# Patient Record
Sex: Female | Born: 1935
Health system: Southern US, Community
[De-identification: ages and names within clinical notes are randomized; demographics above are authoritative.]

## PROBLEM LIST (undated history)

## (undated) DIAGNOSIS — K219 Gastro-esophageal reflux disease without esophagitis: Secondary | ICD-10-CM

## (undated) DIAGNOSIS — I10 Essential (primary) hypertension: Secondary | ICD-10-CM

## (undated) DIAGNOSIS — K5792 Diverticulitis of intestine, part unspecified, without perforation or abscess without bleeding: Secondary | ICD-10-CM

## (undated) HISTORY — PX: ABDOMINAL HYSTERECTOMY: SHX81

## (undated) HISTORY — PX: ABDOMINAL SURGERY: SHX537

---

## 1998-05-28 ENCOUNTER — Encounter: Payer: Self-pay | Admitting: Orthopedic Surgery

## 1998-05-28 ENCOUNTER — Ambulatory Visit (HOSPITAL_COMMUNITY): Admission: RE | Admit: 1998-05-28 | Discharge: 1998-05-28 | Payer: Self-pay | Admitting: Orthopedic Surgery

## 1999-12-06 ENCOUNTER — Encounter: Admission: RE | Admit: 1999-12-06 | Discharge: 1999-12-06 | Payer: Self-pay | Admitting: Internal Medicine

## 1999-12-06 ENCOUNTER — Encounter: Payer: Self-pay | Admitting: Internal Medicine

## 2000-04-03 ENCOUNTER — Encounter: Payer: Self-pay | Admitting: Internal Medicine

## 2000-04-03 ENCOUNTER — Encounter: Admission: RE | Admit: 2000-04-03 | Discharge: 2000-04-03 | Payer: Self-pay | Admitting: Internal Medicine

## 2002-04-07 ENCOUNTER — Encounter: Payer: Self-pay | Admitting: Internal Medicine

## 2002-04-07 ENCOUNTER — Encounter: Admission: RE | Admit: 2002-04-07 | Discharge: 2002-04-07 | Payer: Self-pay | Admitting: Internal Medicine

## 2002-04-11 ENCOUNTER — Encounter: Payer: Self-pay | Admitting: Internal Medicine

## 2002-04-11 ENCOUNTER — Encounter: Admission: RE | Admit: 2002-04-11 | Discharge: 2002-04-11 | Payer: Self-pay | Admitting: Internal Medicine

## 2002-07-01 ENCOUNTER — Encounter: Payer: Self-pay | Admitting: Orthopedic Surgery

## 2002-07-07 ENCOUNTER — Observation Stay (HOSPITAL_COMMUNITY): Admission: RE | Admit: 2002-07-07 | Discharge: 2002-07-08 | Payer: Self-pay | Admitting: Orthopedic Surgery

## 2003-04-12 ENCOUNTER — Encounter: Admission: RE | Admit: 2003-04-12 | Discharge: 2003-04-12 | Payer: Self-pay | Admitting: Internal Medicine

## 2003-05-12 ENCOUNTER — Other Ambulatory Visit: Admission: RE | Admit: 2003-05-12 | Discharge: 2003-05-12 | Payer: Self-pay | Admitting: Internal Medicine

## 2004-07-04 ENCOUNTER — Emergency Department (HOSPITAL_COMMUNITY): Admission: EM | Admit: 2004-07-04 | Discharge: 2004-07-04 | Payer: Self-pay | Admitting: Emergency Medicine

## 2004-07-11 ENCOUNTER — Encounter: Admission: RE | Admit: 2004-07-11 | Discharge: 2004-07-11 | Payer: Self-pay | Admitting: Internal Medicine

## 2005-04-04 ENCOUNTER — Emergency Department (HOSPITAL_COMMUNITY): Admission: EM | Admit: 2005-04-04 | Discharge: 2005-04-04 | Payer: Self-pay | Admitting: Emergency Medicine

## 2005-08-21 ENCOUNTER — Ambulatory Visit (HOSPITAL_BASED_OUTPATIENT_CLINIC_OR_DEPARTMENT_OTHER): Admission: RE | Admit: 2005-08-21 | Discharge: 2005-08-21 | Payer: Self-pay | Admitting: Orthopedic Surgery

## 2005-08-21 ENCOUNTER — Encounter (INDEPENDENT_AMBULATORY_CARE_PROVIDER_SITE_OTHER): Payer: Self-pay | Admitting: Specialist

## 2005-09-10 ENCOUNTER — Encounter: Admission: RE | Admit: 2005-09-10 | Discharge: 2005-09-10 | Payer: Self-pay | Admitting: Internal Medicine

## 2006-10-21 ENCOUNTER — Encounter: Admission: RE | Admit: 2006-10-21 | Discharge: 2006-10-21 | Payer: Self-pay | Admitting: Family Medicine

## 2007-11-30 ENCOUNTER — Encounter: Admission: RE | Admit: 2007-11-30 | Discharge: 2007-11-30 | Payer: Self-pay | Admitting: Family Medicine

## 2008-02-03 ENCOUNTER — Emergency Department (HOSPITAL_BASED_OUTPATIENT_CLINIC_OR_DEPARTMENT_OTHER): Admission: EM | Admit: 2008-02-03 | Discharge: 2008-02-03 | Payer: Self-pay | Admitting: Emergency Medicine

## 2008-02-03 ENCOUNTER — Ambulatory Visit: Payer: Self-pay | Admitting: Diagnostic Radiology

## 2008-08-10 ENCOUNTER — Encounter: Admission: RE | Admit: 2008-08-10 | Discharge: 2008-08-10 | Payer: Self-pay | Admitting: Family Medicine

## 2008-08-26 ENCOUNTER — Encounter: Admission: RE | Admit: 2008-08-26 | Discharge: 2008-08-26 | Payer: Self-pay | Admitting: Family Medicine

## 2008-10-05 ENCOUNTER — Encounter: Admission: RE | Admit: 2008-10-05 | Discharge: 2008-11-09 | Payer: Self-pay | Admitting: Anesthesiology

## 2008-10-05 ENCOUNTER — Encounter: Admission: RE | Admit: 2008-10-05 | Discharge: 2008-11-09 | Payer: Self-pay | Admitting: Neurological Surgery

## 2008-11-04 ENCOUNTER — Ambulatory Visit: Payer: Self-pay | Admitting: Internal Medicine

## 2008-11-04 ENCOUNTER — Inpatient Hospital Stay (HOSPITAL_COMMUNITY): Admission: EM | Admit: 2008-11-04 | Discharge: 2008-11-09 | Payer: Self-pay | Admitting: Emergency Medicine

## 2008-11-05 ENCOUNTER — Encounter: Payer: Self-pay | Admitting: Internal Medicine

## 2008-11-06 ENCOUNTER — Encounter: Payer: Self-pay | Admitting: Internal Medicine

## 2008-11-30 ENCOUNTER — Encounter: Admission: RE | Admit: 2008-11-30 | Discharge: 2008-11-30 | Payer: Self-pay | Admitting: Family Medicine

## 2008-12-26 IMAGING — CR DG HIP COMPLETE 2+V*R*
3 series · 7 of 7 positions shown · non-contrast
Comparison: NONE

CLINICAL DATA: Pain. 

RIGHT HIP

[Series 1: view not recorded · 0.17mm/px · 2 of 2 slices shown (1 of 3)]
[im 1/2]
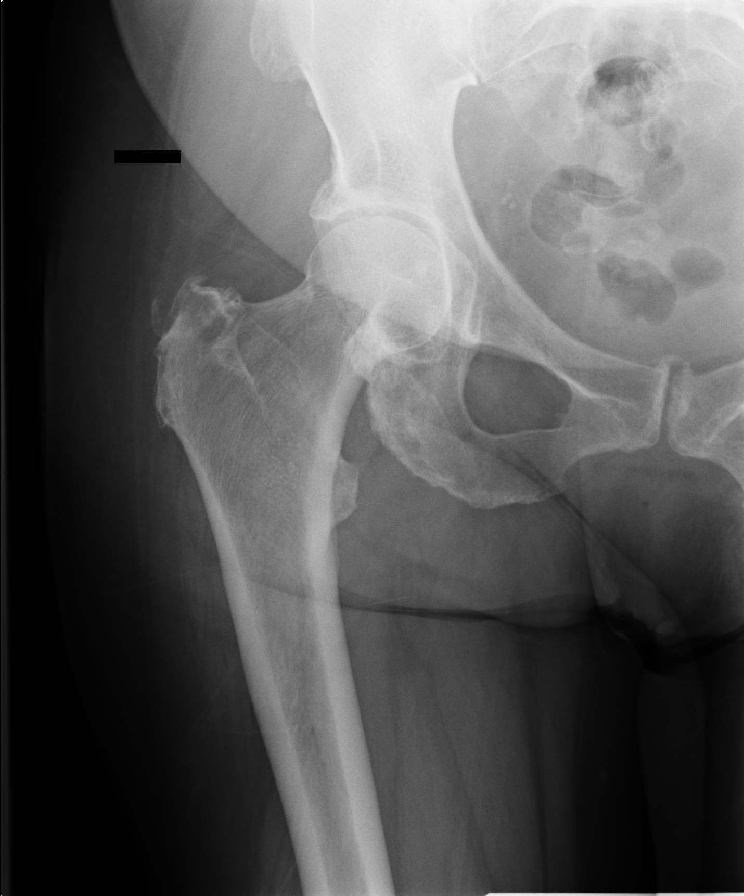
[im 2/2]
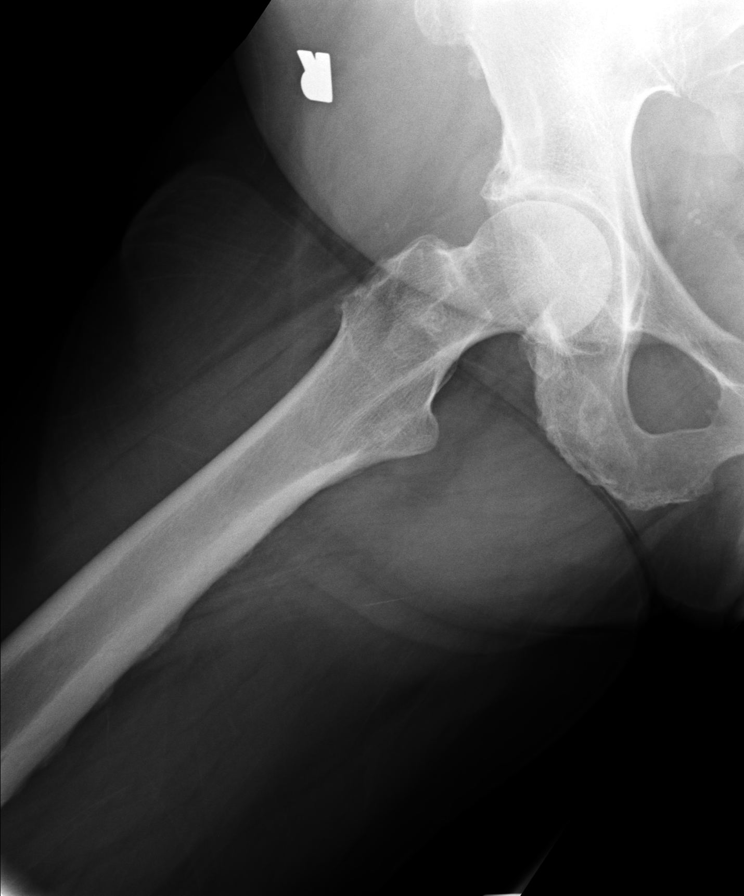

[Series 2: view not recorded · 0.17mm/px · 4 of 4 slices shown (2 of 3)]
[im 1/4]
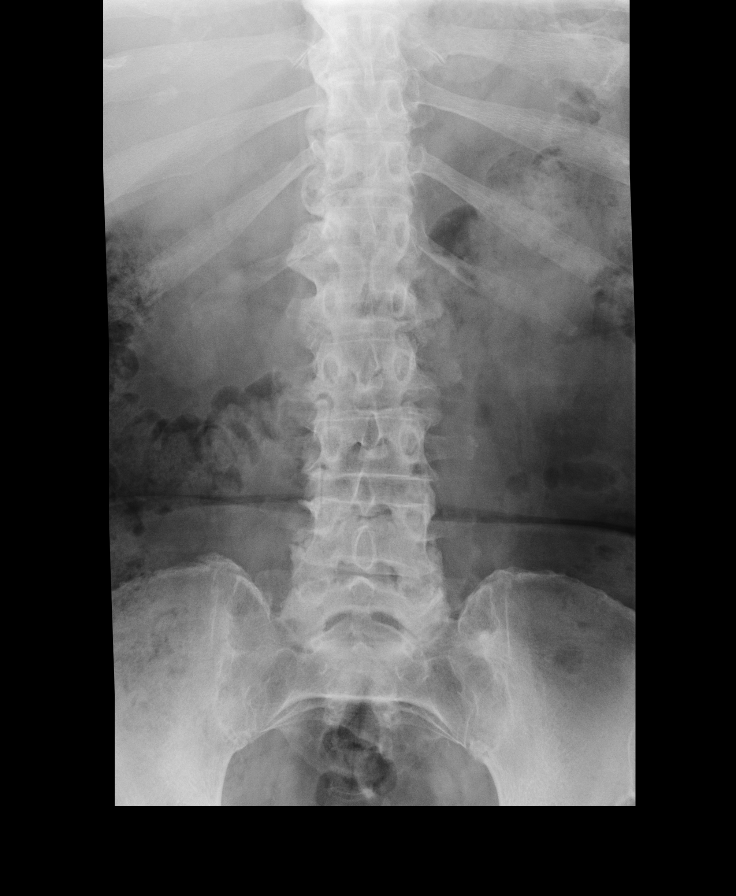
[im 2/4]
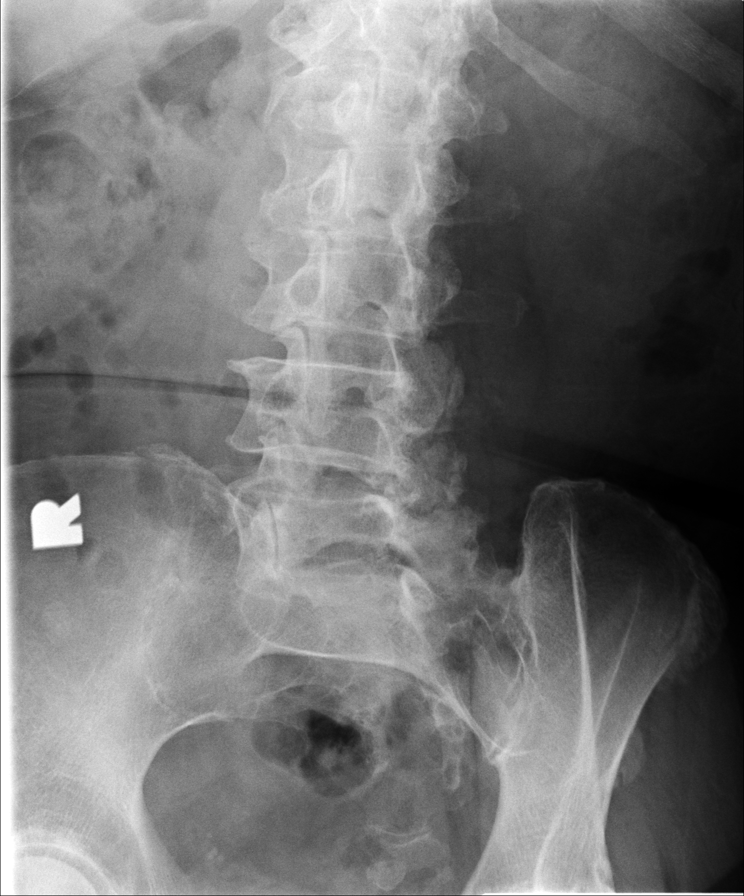
[im 3/4]
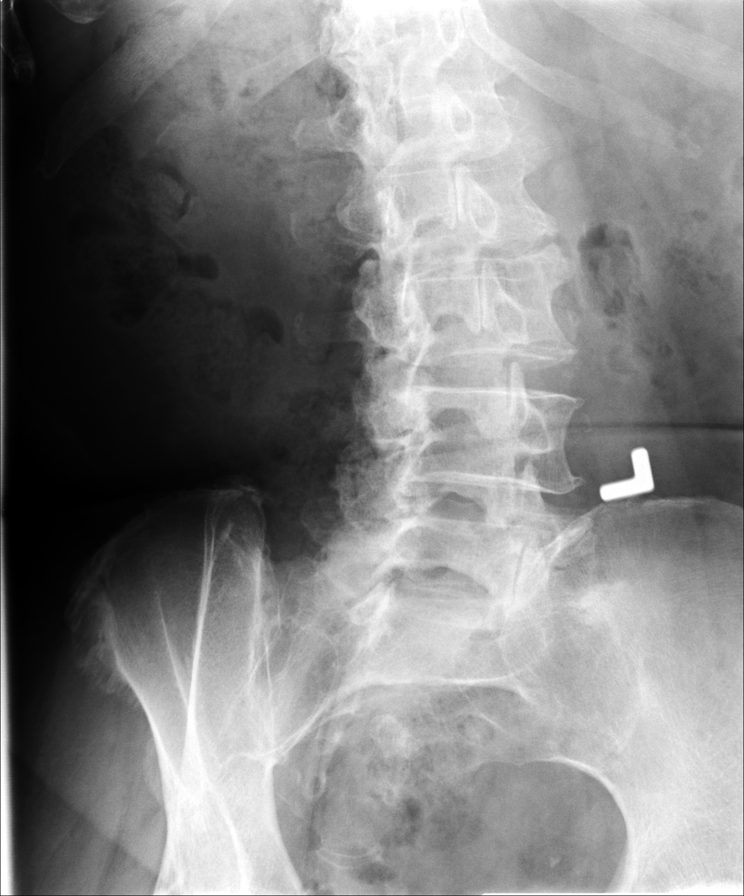
[im 4/4]
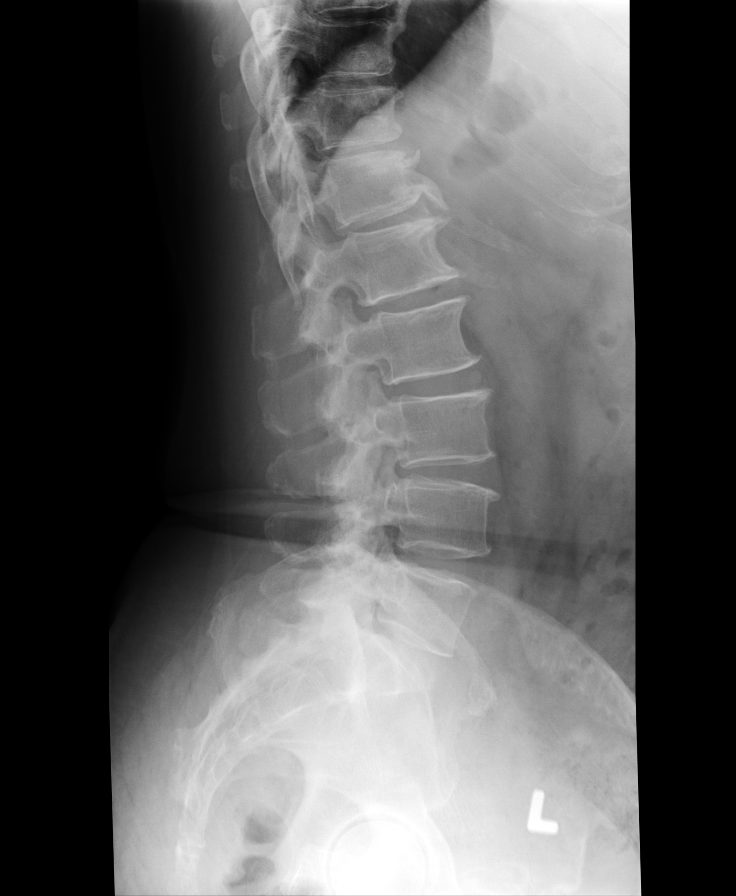

[view not recorded (3 of 3)]
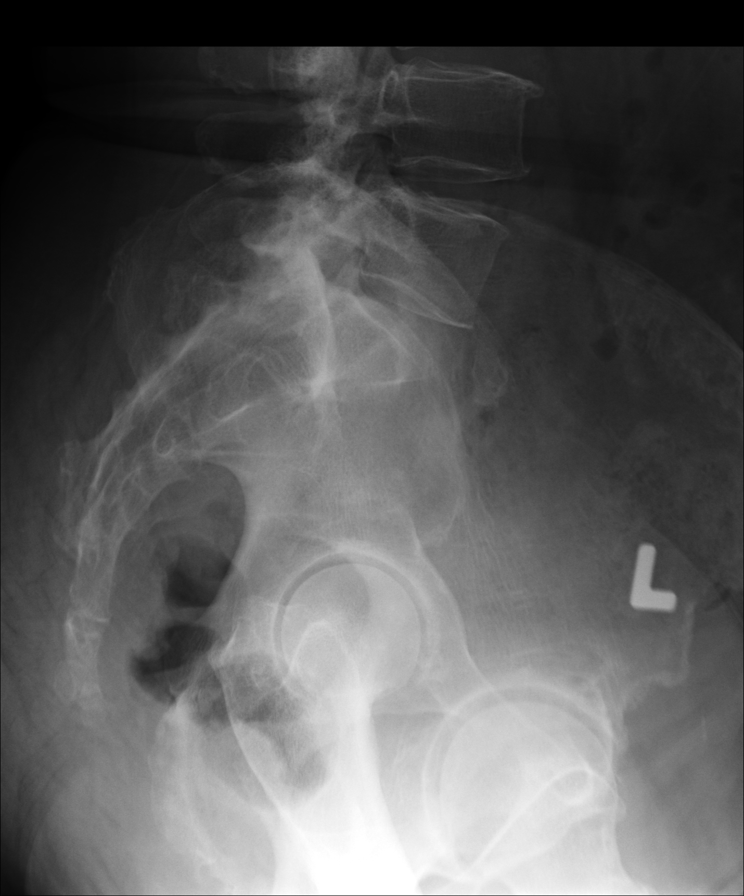

[7 of 7 positions shown; findings below may reference images not displayed]

FINDINGS: Minimal acetabular spurring is noted. No fracture, 
dislocation, or loose bodies. Spurring is noted at the level of 
the greater trochanter. There is a tiny calcification adjacent to 
the greater trochanter suggesting an area of minimal calcific 
bursitis.
IMPRESSION: Degenerative changes of the right hip. Probable 
calcific bursitis. Blomberg, Flore electronically reviewed 
on 10/05/2006 Dict Date: 10/05/2006  Tran Date: 10/05/2006 DAS  [REDACTED]

## 2009-12-19 ENCOUNTER — Encounter: Admission: RE | Admit: 2009-12-19 | Discharge: 2009-12-19 | Payer: Self-pay | Admitting: Family Medicine

## 2010-03-02 ENCOUNTER — Encounter: Payer: Self-pay | Admitting: Family Medicine

## 2010-05-04 ENCOUNTER — Emergency Department (HOSPITAL_BASED_OUTPATIENT_CLINIC_OR_DEPARTMENT_OTHER)
Admission: EM | Admit: 2010-05-04 | Discharge: 2010-05-05 | Disposition: A | Discharge status: Nursing Home (Includes ICF) | Payer: Medicare Other | Source: Home / Self Care | Attending: Emergency Medicine | Admitting: Emergency Medicine

## 2010-05-04 ENCOUNTER — Emergency Department (INDEPENDENT_AMBULATORY_CARE_PROVIDER_SITE_OTHER): Payer: Medicare Other

## 2010-05-04 DIAGNOSIS — K625 Hemorrhage of anus and rectum: Secondary | ICD-10-CM

## 2010-05-04 DIAGNOSIS — I10 Essential (primary) hypertension: Secondary | ICD-10-CM | POA: Insufficient documentation

## 2010-05-04 DIAGNOSIS — K922 Gastrointestinal hemorrhage, unspecified: Secondary | ICD-10-CM | POA: Insufficient documentation

## 2010-05-04 DIAGNOSIS — K219 Gastro-esophageal reflux disease without esophagitis: Secondary | ICD-10-CM | POA: Insufficient documentation

## 2010-05-04 DIAGNOSIS — M810 Age-related osteoporosis without current pathological fracture: Secondary | ICD-10-CM | POA: Insufficient documentation

## 2010-05-04 DIAGNOSIS — E7800 Pure hypercholesterolemia, unspecified: Secondary | ICD-10-CM | POA: Insufficient documentation

## 2010-05-04 DIAGNOSIS — Z79899 Other long term (current) drug therapy: Secondary | ICD-10-CM | POA: Insufficient documentation

## 2010-05-04 LAB — COMPREHENSIVE METABOLIC PANEL
Albumin: 4 g/dL (ref 3.5–5.2)
BUN: 27 mg/dL — ABNORMAL HIGH (ref 6–23)
CO2: 28 mEq/L (ref 19–32)
Calcium: 9.1 mg/dL (ref 8.4–10.5)
Chloride: 104 mEq/L (ref 96–112)
Creatinine, Ser: 1 mg/dL (ref 0.4–1.2)
GFR calc Af Amer: >60 mL/min (ref >60)
Glucose, Bld: 121 mg/dL — ABNORMAL HIGH (ref 70–99)
Potassium: 4 mEq/L (ref 3.5–5.1)
Total Protein: 7.7 g/dL (ref 6.0–8.3)

## 2010-05-04 LAB — DIFFERENTIAL: Neutro Abs: 4.3 10*3/uL (ref 1.7–7.7)

## 2010-05-04 LAB — URINALYSIS, ROUTINE W REFLEX MICROSCOPIC
Leukocytes, UA: NEGATIVE
Protein, ur: NEGATIVE mg/dL
Specific Gravity, Urine: 1.021 (ref 1.005–1.030)
Urobilinogen, UA: 1 mg/dL (ref 0.0–1.0)

## 2010-05-04 LAB — CBC
MCH: 29.5 pg (ref 26.0–34.0)
MCHC: 34.1 g/dL (ref 30.0–36.0)
Platelets: 275 10*3/uL (ref 150–400)
RBC: 3.8 MIL/uL — ABNORMAL LOW (ref 3.87–5.11)
RDW: 12.9 % (ref 11.5–15.5)
WBC: 6.6 10*3/uL (ref 4.0–10.5)

## 2010-05-04 LAB — URINE MICROSCOPIC-ADD ON

## 2010-05-04 LAB — HEMOCCULT GUIAC POC 1CARD (OFFICE): Fecal Occult Bld: POSITIVE

## 2010-05-05 ENCOUNTER — Inpatient Hospital Stay (HOSPITAL_COMMUNITY)
Admission: EM | Admit: 2010-05-05 | Discharge: 2010-05-06 | DRG: 378 | Disposition: A | Discharge status: Home / Self Care | Payer: Medicare Other | Source: Other Acute Inpatient Hospital | Attending: Internal Medicine | Admitting: Internal Medicine

## 2010-05-05 DIAGNOSIS — K5731 Diverticulosis of large intestine without perforation or abscess with bleeding: Principal | ICD-10-CM | POA: Diagnosis present

## 2010-05-05 DIAGNOSIS — K219 Gastro-esophageal reflux disease without esophagitis: Secondary | ICD-10-CM | POA: Diagnosis present

## 2010-05-05 DIAGNOSIS — Z79899 Other long term (current) drug therapy: Secondary | ICD-10-CM

## 2010-05-05 DIAGNOSIS — M810 Age-related osteoporosis without current pathological fracture: Secondary | ICD-10-CM | POA: Diagnosis present

## 2010-05-05 DIAGNOSIS — D62 Acute posthemorrhagic anemia: Secondary | ICD-10-CM | POA: Diagnosis present

## 2010-05-05 DIAGNOSIS — I10 Essential (primary) hypertension: Secondary | ICD-10-CM | POA: Diagnosis present

## 2010-05-05 DIAGNOSIS — E785 Hyperlipidemia, unspecified: Secondary | ICD-10-CM | POA: Diagnosis present

## 2010-05-05 DIAGNOSIS — M1990 Unspecified osteoarthritis, unspecified site: Secondary | ICD-10-CM | POA: Diagnosis present

## 2010-05-05 LAB — BASIC METABOLIC PANEL
Calcium: 8.2 mg/dL — ABNORMAL LOW (ref 8.4–10.5)
Creatinine, Ser: 0.81 mg/dL (ref 0.4–1.2)
GFR calc Af Amer: >60 mL/min (ref >60)

## 2010-05-05 LAB — DIFFERENTIAL
Basophils Absolute: 0 10*3/uL (ref 0.0–0.1)
Basophils Relative: 1 % (ref 0–1)
Eosinophils Absolute: 0.2 10*3/uL (ref 0.0–0.7)
Eosinophils Relative: 4 % (ref 0–5)
Lymphs Abs: 1.7 10*3/uL (ref 0.7–4.0)

## 2010-05-05 LAB — CBC
HCT: 27.2 % — ABNORMAL LOW (ref 36.0–46.0)
Hemoglobin: 9.3 g/dL — ABNORMAL LOW (ref 12.0–15.0)
MCH: 29.5 pg (ref 26.0–34.0)
MCV: 86.3 fL (ref 78.0–100.0)
RDW: 13.4 % (ref 11.5–15.5)
WBC: 4.7 10*3/uL (ref 4.0–10.5)

## 2010-05-05 LAB — HEMOGLOBIN AND HEMATOCRIT, BLOOD: HCT: 28.8 % — ABNORMAL LOW (ref 36.0–46.0)

## 2010-05-05 LAB — TYPE AND SCREEN: ABO/RH(D): AB POS

## 2010-05-06 LAB — URINE CULTURE

## 2010-05-06 LAB — CBC
MCV: 86.9 fL (ref 78.0–100.0)
Platelets: 261 10*3/uL (ref 150–400)
RBC: 3.35 MIL/uL — ABNORMAL LOW (ref 3.87–5.11)
WBC: 4.5 10*3/uL (ref 4.0–10.5)

## 2010-05-06 LAB — BASIC METABOLIC PANEL
BUN: 13 mg/dL (ref 6–23)
CO2: 29 mEq/L (ref 19–32)
Calcium: 8.5 mg/dL (ref 8.4–10.5)
Creatinine, Ser: 0.9 mg/dL (ref 0.4–1.2)
Potassium: 3.4 mEq/L — ABNORMAL LOW (ref 3.5–5.1)

## 2010-05-08 NOTE — H&P (Signed)
NAMEANTHONIA, [name] NO.:  [id number]  MEDICAL RECORD NO.:  [id number]           PATIENT TYPE:  I  LOCATION:  3713                         FACILITY:  [facility]  PHYSICIAN:  [name], M.D. DATE OF BIRTH:  1935-01-17  DATE OF ADMISSION:  05/05/2010 DATE OF DISCHARGE:                             HISTORY & PHYSICAL   PRIMARY CARE PHYSICIAN:  Veronica Maduro A. Nicholos Johns, MD  CHIEF COMPLAINT:  Rectal bleeding.  HISTORY OF PRESENT ILLNESS:  This is a 76 year old female who was seen at the [facility] emergency department secondary to three episodes of rectal bleeding which she describes as being dark maroon color blood.  She states symptoms started since the a.m.  She denies having any abdominal pain, nausea, vomiting, diarrhea.  The patient reports recently having a pulled muscle in her right thigh and taking Naprosyn daily for her pain.  She also states she has been taking a muscle relaxer.  The patient had a similar hospitalization in September 2010.  She was evaluated by GI at that time by Dr. Stan Head and was found to have diverticulosis and a diverticular bleed.  The patient had severe anemia at that time and was transfused 3 units of blood.  Her admission hemoglobin level today was found to be 11.2.  The patient denies having any chest pain, shortness of breath, fevers, chills, nausea, vomiting, diarrhea, syncope, fatigue or weakness.  PAST MEDICAL HISTORY:  Significant for diverticulosis/diverticulitis, hypertension, dyslipidemia, osteoporosis, osteoarthritis, gastroesophageal reflux disease, status post left rotator cuff repair, status post hysterectomy, status post right carpal tunnel release, and right thumb release, status post exploratory laparotomy secondary to what sounded like a volvulus with bowel resection.  MEDICATIONS:  At this time include alendronate, cyclobenzaprine, hydrochlorothiazide, iron, Naprosyn and pravastatin.  ALLERGIES:   PENICILLIN causing hives.  SOCIAL HISTORY:  The patient is a nonsmoker, nondrinker.  No history of illicit drug usage.  FAMILY HISTORY:  Positive for hypertension in her father, positive for cancer in one brother and one sister.  Sister unknown type was thought to be a GYN cancer and her brother had oropharyngeal cancer.  REVIEW OF SYSTEMS:  Pertinent as mentioned above.  PHYSICAL EXAMINATION FINDINGS:  GENERAL:  This is a pleasant 76 year old elderly African American female who is well-nourished and well-developed and currently in no acute distress. VITAL SIGNS:  Temperature 98.5, blood pressure 120/68, heart rate 104 initially now 83, respirations 20, O2 sats 99-100%. HEENT:  Normocephalic, atraumatic.  Pupils equally round and reactive to light.  Extraocular movements are intact.  Funduscopic benign.  There is no scleral icterus.  Nares are patent bilaterally.  Oropharynx is clear. NECK:  Supple, full range of motion.  No thyromegaly, adenopathy, jugular venous distention. CARDIOVASCULAR:  Regular rate and rhythm.  No murmurs, gallops or rubs appreciated. LUNGS:  Clear to auscultation bilaterally.  No rales, rhonchi or wheezes. ABDOMEN:  Positive bowel sounds, soft, nontender, nondistended.  No hepatosplenomegaly. EXTREMITIES:  Without cyanosis, clubbing or edema. NEUROLOGIC:  Nonfocal.  LABORATORY STUDIES:  White blood cell count 6.6, hemoglobin 11.2, hematocrit 32.8, MCV 86.3, platelets 175, neutrophils 65%, lymphocytes  23%.  Rectal examination performed by EDP found to be Hemoccult positive, dark stool.  Sodium 142, potassium 4.0, chloride 104, CO2 28, BUN 27, creatinine 1.0 and glucose 121.  Albumin 4.0, AST 29, ALT 22. Urinalysis with trace hemoglobin but otherwise negative.  Acute abdominal series performed revealing no signs of obstruction.  No free air and no acute cardiopulmonary disease findings.  ASSESSMENT:  A 76 year old female being admitted with: 1. Rectal  bleeding. 2. Mild microcytic anemia secondary to rectal bleeding. 3. Gastroesophageal reflux disease. 4. Muscle strain. 5. Osteoporosis. 6. Osteoarthritis.  PLAN:  The patient will be admitted and serial H and Hs will be performed q.8 hours x48 hours.  IV fluids have been ordered for fluid resuscitation and maintenance therapy and the patient will be placed on clear liquids at this time.  IV Protonix therapy has been ordered and a GI consultation will be placed in the a.m. sooner if needed.  The patient's regular medications will be reviewed and reconciled.  Of note, the patient had been on Naprosyn therapy daily.  This may also be contributing to her episodes of GI bleeding secondary to nonsteroidal anti-inflammatory usage.  The Naprosyn therapy will be discontinued at this time.  A type and screen will also be sent and the patient will be transfused if needed.  SCDs have been ordered for DVT prophylaxis. Further workup will ensue pending results of the patient's clinical course.     [name], M.D.     HJ/MEDQ  D:  05/05/2010  T:  05/05/2010  Job:  [id number]  cc:   [name], M.D.  Electronically Signed by [name] M.D. on 05/08/2010 03:24:11 AM

## 2010-05-13 NOTE — Discharge Summary (Signed)
[name], [name] NO.:  [id number]  MEDICAL RECORD NO.:  [id number]           PATIENT TYPE:  I  LOCATION:  3713                         FACILITY:  [facility]  PHYSICIAN:  Veronica Nephew, MD       DATE OF BIRTH:  [birth year]  DATE OF ADMISSION:  05/05/2010 DATE OF DISCHARGE:  05/06/2010                        DISCHARGE SUMMARY - REFERRING   PRIMARY CARE PHYSICIAN:  Veronica Maduro A. Nicholos Johns, MD  DISCHARGE DIAGNOSES: 1. Hematochezia secondary to probable diverticular bleed, resolved. 2. Acute blood loss anemia, stable. 3. Hypertension. 4. Hyperlipidemia. 5. Gastroesophageal reflux disease.  DISCHARGE MEDICATIONS: 1. Acetaminophen 650 mg by mouth every 4 hours as needed. 2. Oxycodone 5 mg by mouth every 5 hours as needed. 3. Flexeril 5 mg 1 tablet by mouth daily at bedtime as needed. 4. Fosamax 70 mg 1 tablet by mouth every Tuesday as needed. 5. Hydrochlorothiazide 25 mg p.o. daily start 2 days after discharge. 6. Iron 325 mg p.o. daily. 7. Pravastatin 40 mg p.o. daily. 8. Senna docusate 1 tablet by mouth daily at bedtime as needed.  DISPOSITION:  Home.  DIET:  Heart-healthy avoiding any kind of NSAIDs.  PROCEDURES PERFORMED:  The patient had two-view abdominal x-ray which showed nonobstructive bowel gas pattern.  No active cardiopulmonary process.  CONSULTATIONS ON THIS CASE:  None.  Dr. Lavera Wilcox had a telephone consultation with Dr. Christella Wilcox, [facility].  FOLLOWUP:  The patient should follow up with primary care physician, [name] within 1 week.  The patient should also follow up with Dr. Stan Wilcox in about 2-3 weeks.  BRIEF HISTORY OF PRESENT ILLNESS:  This is a 76 year old female who was seen at [facility] secondary to three episodes of rectal bleeding which she describes it being dark maroon colored blood.  The patient had a similar hospitalization on September 2010.  She was evaluated by that time by Dr. Leone Wilcox and was found to  have diverticulosis and diverticular bleed.  HOSPITAL COURSE: 1. Hematochezia.  The patient was admitted to the hospital.  The     patient was observed.  The patient's initial hemoglobin on May 04, 2010, was 11.2 over 32.8.  The patient's subsequent hemoglobins     were 9.3 over 27.2, 9.6 over 28.8 and 9.8 over 29.1.  The patient     did not receive any blood transfusions.  The patient had no further     episodes of rectal bleeding when I saw her on May 06, 2010, and     the patient was deemed ready for discharge. 2. Acute blood loss anemia.  The patient has some acute blood loss     anemia.  The patient was taking iron at home, the iron should be     continued. 3. Hypertension.  The patient's blood pressure has been controlled.     The hydrochlorothiazide was withheld. 4. Hyperlipidemia.  The patient was continued on statin. 5. GERD.  The patient has a history of GERD.  However, she does not     take a PPI.  The patient will be discharged home without a  PPI     since she was not taking it at home.     Veronica Nephew, MD     NH/MEDQ  D:  05/06/2010  T:  05/06/2010  Job:  [id number]  cc:   [name], M.D.  Electronically Signed by Veronica Nephew MD on 05/13/2010 10:39:02 AM

## 2010-05-17 LAB — CBC
HCT: 18.5 % — ABNORMAL LOW (ref 36.0–46.0)
HCT: 24.8 % — ABNORMAL LOW (ref 36.0–46.0)
HCT: 25.4 % — ABNORMAL LOW (ref 36.0–46.0)
HCT: 31.9 % — ABNORMAL LOW (ref 36.0–46.0)
Hemoglobin: 10.9 g/dL — ABNORMAL LOW (ref 12.0–15.0)
MCHC: 34.2 g/dL (ref 30.0–36.0)
MCHC: 34.3 g/dL (ref 30.0–36.0)
MCHC: 34.3 g/dL (ref 30.0–36.0)
MCV: 88 fL (ref 78.0–100.0)
MCV: 90.1 fL (ref 78.0–100.0)
MCV: 90.6 fL (ref 78.0–100.0)
Platelets: 167 10*3/uL (ref 150–400)
Platelets: 182 10*3/uL (ref 150–400)
Platelets: 218 10*3/uL (ref 150–400)
Platelets: 260 10*3/uL (ref 150–400)
RBC: 2.04 MIL/uL — ABNORMAL LOW (ref 3.87–5.11)
RBC: 2.64 MIL/uL — ABNORMAL LOW (ref 3.87–5.11)
RBC: 2.64 MIL/uL — ABNORMAL LOW (ref 3.87–5.11)
RBC: 2.89 MIL/uL — ABNORMAL LOW (ref 3.87–5.11)
RBC: 3.53 MIL/uL — ABNORMAL LOW (ref 3.87–5.11)
RDW: 13.8 % (ref 11.5–15.5)
RDW: 14 % (ref 11.5–15.5)
WBC: 10 10*3/uL (ref 4.0–10.5)
WBC: 10.4 10*3/uL (ref 4.0–10.5)
WBC: 12.3 10*3/uL — ABNORMAL HIGH (ref 4.0–10.5)
WBC: 9.5 10*3/uL (ref 4.0–10.5)

## 2010-05-17 LAB — BASIC METABOLIC PANEL
BUN: 10 mg/dL (ref 6–23)
BUN: 11 mg/dL (ref 6–23)
BUN: 12 mg/dL (ref 6–23)
BUN: 9 mg/dL (ref 6–23)
CO2: 25 mEq/L (ref 19–32)
CO2: 27 mEq/L (ref 19–32)
CO2: 28 mEq/L (ref 19–32)
Chloride: 103 mEq/L (ref 96–112)
Chloride: 106 mEq/L (ref 96–112)
Creatinine, Ser: 0.85 mg/dL (ref 0.4–1.2)
Creatinine, Ser: 0.86 mg/dL (ref 0.4–1.2)
GFR calc Af Amer: >60 mL/min (ref >60)
GFR calc Af Amer: >60 mL/min (ref >60)
GFR calc non Af Amer: >60 mL/min (ref >60)
GFR calc non Af Amer: >60 mL/min (ref >60)
GFR calc non Af Amer: >60 mL/min (ref >60)
Glucose, Bld: 129 mg/dL — ABNORMAL HIGH (ref 70–99)
Glucose, Bld: 207 mg/dL — ABNORMAL HIGH (ref 70–99)
Potassium: 3.2 mEq/L — ABNORMAL LOW (ref 3.5–5.1)
Potassium: 3.6 mEq/L (ref 3.5–5.1)
Potassium: 3.7 mEq/L (ref 3.5–5.1)
Potassium: 4.2 mEq/L (ref 3.5–5.1)
Sodium: 137 mEq/L (ref 135–145)
Sodium: 139 mEq/L (ref 135–145)

## 2010-05-17 LAB — HEMOGLOBIN AND HEMATOCRIT, BLOOD
HCT: 22.4 % — ABNORMAL LOW (ref 36.0–46.0)
HCT: 25.9 % — ABNORMAL LOW (ref 36.0–46.0)
HCT: 26.2 % — ABNORMAL LOW (ref 36.0–46.0)
HCT: 26.4 % — ABNORMAL LOW (ref 36.0–46.0)
HCT: 26.4 % — ABNORMAL LOW (ref 36.0–46.0)
Hemoglobin: 8.8 g/dL — ABNORMAL LOW (ref 12.0–15.0)
Hemoglobin: 8.9 g/dL — ABNORMAL LOW (ref 12.0–15.0)
Hemoglobin: 8.9 g/dL — ABNORMAL LOW (ref 12.0–15.0)
Hemoglobin: 8.9 g/dL — ABNORMAL LOW (ref 12.0–15.0)
Hemoglobin: 9.9 g/dL — ABNORMAL LOW (ref 12.0–15.0)

## 2010-05-17 LAB — PREPARE RBC (CROSSMATCH)

## 2010-05-17 LAB — CROSSMATCH: ABO/RH(D): AB POS

## 2010-05-17 LAB — DIFFERENTIAL
Basophils Absolute: 0 10*3/uL (ref 0.0–0.1)
Basophils Relative: 0 % (ref 0–1)
Eosinophils Absolute: 0.2 10*3/uL (ref 0.0–0.7)
Lymphs Abs: 2 10*3/uL (ref 0.7–4.0)
Monocytes Relative: 7 % (ref 3–12)
Neutro Abs: 6.1 10*3/uL (ref 1.7–7.7)

## 2010-05-17 LAB — PROTIME-INR: INR: 1.1 (ref 0.00–1.49)

## 2010-05-17 LAB — APTT: aPTT: 27 seconds (ref 24–37)

## 2010-05-17 LAB — PREPARE FRESH FROZEN PLASMA

## 2010-05-17 LAB — ABO/RH: ABO/RH(D): AB POS

## 2010-06-28 NOTE — Op Note (Signed)
NAMEPEGGE, CUMBERLEDGE NO.:  [id number]   MEDICAL RECORD NO.:  [id number]          PATIENT TYPE:  AMB   LOCATION:  DSC                          FACILITY:  [facility]   PHYSICIAN:  Cindee Salt, M.D.       DATE OF BIRTH:  June 06, 1934   DATE OF PROCEDURE:  08/21/2005  DATE OF DISCHARGE:                                 OPERATIVE REPORT   PREOPERATIVE DIAGNOSIS:  Carpal tunnel syndrome, right hand mass,  degenerative arthritis, IP joint right thumb.   POSTOPERATIVE DIAGNOSIS:  Carpal tunnel syndrome, right hand mass,  degenerative arthritis, IP joint right thumb.   OPERATION:  Right carpal tunnel release with excision mass, debridement of  IP joint right thumb.   SURGEONMerlyn Lot, M.D.   ASSISTANT:  Carolyne Fiscal R.N.   ANESTHESIA:  General.   HISTORY:  The patient is a 76 year old female with a history of carpal  tunnel syndrome, EMG nerve conductions positive which has not responded to  conservative treatment, a mass on the IP joint of her right thumb with  degenerative changes beneath.   PROCEDURE:  The patient was brought to the operating room where a general  anesthetic was carried out without difficulty.  This was done after marking  the area of incision, discussing the surgical procedure and marking with the  patient. Questions were entertained answered.  A general anesthetic was  carried out.  She was prepped using DuraPrep, supine position, right arm  free.  After 3-minute dry time, the wound was draped.  Limb was  exsanguinated with an Esmarch bandage.  Tourniquet placed high on the arm  was inflated to 250 mmHg.  A straight incision was made longitudinally in  the palm, carried down through subcutaneous tissue.  Bleeders were  electrocauterized.  Palmar fascia was split.  Superficial palmar arch  identified.  The flexor tendon to the ring and little finger identified to  the ulnar side of median nerve.  The carpal retinaculum was incised with  sharp dissection.   Right angle and Sewall retractor were placed between skin  and forearm fascia.  Fascia was released for approximately a centimeter half  proximal to the wrist crease under direct vision.  The canal was explored.  No further lesions were identified.  The wound was irrigated.  The skin was  closed interrupted 5-0 nylon sutures.  A separate incision was then made  over the IP joint and thumb.  This was curvilinear in nature, transverse  distally, carried down through subcutaneous tissue.  Bleeders again  electrocauterized.  Cystic lesion with significant synovial intervention was  found on the radial aspect.  This was excised.  A large exostosis was  present on the radial aspect of the proximal phalanx.  This was removed with  a rongeur.  Debridement the joint performed.  No further lesions were  identified.  Specimens sent to pathology.  The wound was irrigated.  The  skin was then closed with interrupted 5-0  nylon sutures.  Sterile compressive dressing, splint to the thumb and to the  wrist applied.  The patient  tolerated the procedure well and was taken to  the recovery observation in satisfactory condition.  She is discharged home  to return to the [facility] in 1 week on Vicodin.           ______________________________  Cindee Salt, M.D.     GK/MEDQ  D:  08/21/2005  T:  08/21/2005  Job:  [id number]

## 2010-06-28 NOTE — Op Note (Signed)
NAME:  [name], [name]                           ACCOUNT NO.:  [id number]   MEDICAL RECORD NO.:  [id number]                   PATIENT TYPE:  AMB   LOCATION:  DAY                                  FACILITY:  [facility]   PHYSICIAN:  Vania Rea. Supple, M.D.               DATE OF BIRTH:  [birth year]   DATE OF PROCEDURE:  07/07/2002  DATE OF DISCHARGE:                                 OPERATIVE REPORT   PREOPERATIVE DIAGNOSES:  1. Chronic retracted left rotator cuff tear.  2. Left shoulder acromioclavicular joint arthrosis.   POSTOPERATIVE DIAGNOSES:  1. Chronic retracted left rotator cuff tear.  2. Left shoulder acromioclavicular joint arthrosis.  3. Additional findings of a prior rupture of the long head of the biceps     tendon and an extensive superior labral tear.   PROCEDURES:  1. Left shoulder open rotator cuff repair with Restore patch augmentation.  2. Subacromial decompression.  3. Distal clavicle resection.  4. Debridement of stump of the biceps tendon and debridement of extensive     superior labral tear.   SURGEON OF RECORD:  Vania Rea. Supple, M.D.   Threasa HeadsFrench Ana A. Shuford, P.A.-C.   ANESTHESIA:  General endotracheal as well as intrascalene block.   ESTIMATED BLOOD LOSS:  150 cc.   DRAINS:  None.   HISTORY:  [name] is a 76 year old female, who has had chronic left  shoulder pain, weakness, and limitations in motion.  She reports pain most  particularly at night.  She has severe pain with attempts at overhead  reaching and lifting.  Her examination shows a globally diminished range of  motion with weakness on manual testing.  Positive drop arm.  Radiographs  confirm again [facility] arthrosis as well as marked spurring of the anterior and  inferior acromion and calcifications within the coracoacromial ligament.  Preoperative MRI scan confirms a markedly retracted tear of the rotator cuff  as well as some apparent mild fatty infiltration of the rotator cuff  musculature.   Due to her ongoing pain and functional limitations, she is  brought to the operating room at this time for planned open left shoulder  surgery as described below.   Preoperatively, [name] was counseled on treatment options as well as  risks versus benefits thereof.  Possible surgical complications of bleeding,  infection, neurovascular injury, persistence of pain, loss of motion,  recurrence of rotator cuff tear, and possible need for additional surgery  were reviewed.  She understands and accepts and agrees with our planned  procedure.   PROCEDURE IN DETAIL:  After undergoing routine preop evaluation, the patient  received prophylactic antibiotics and had intrascalene block established in  the holding area by the anesthesia department.  Placed supine on the  operative table and underwent smooth induction of general endotracheal  anesthesia.  Moved into beach chair position and appropriately padded and  protected.  The  left shoulder girdle region was sterilely prepped and draped  in the standard fashion.  The proposed incision was outlined from the [facility]  joint, extending laterally and distally for a total length of approximately  5 cm.  The proposed incision was infiltrated with 0.5% Marcaine with  epinephrine.  Skin was then sharply divided with a knife as was the  subcutaneous tissue down to the fascia, and this was then split  longitudinally along the course of the fibers of the anterior deltoid.  The  deltoid fibers were then split along the line of the incision.  Subperiosteal dissection was then used to expose the anterior acromion and  the distal clavicle.  There was noted to be calcifications within the  coracoacromial ligament.  An oscillating saw was then used to dive the  anterior acromion, performing a subacromial decompression and allowing [place] to  remove the huge anterior and inferior acromial spur.  The spur was then  dissected distally and resected away from the  attachment at the  coracoacromial ligament.  This allowed access to the acromioclavicular joint  and utilizing subperiosteal dissection, we exposed just a centimeter of the  clavicle and used an oscillating saw to perform a distal clavicle resection.  We then returned our attention to the subacromial space where the  oscillating saw was then used to further contour the undersurface of the  acromion, creating a type 1 morphology.  Inspection of the subacromial space  showed abundant proliferative bursal tissue, and this was removed  circumferentially with a combination of blunt and sharp dissection.  Multiple adhesions in the subdeltoid bursa were divided and were excised.  The retracted, torn margin of the rotator cuff was then identified, and we  mobilized the rotator cuff on both superior and inferior surfaces.  It was  noted to be markedly retracted.  The free margin was debrided sharply to  healthy-appearing tissue.  A series of grasping modified Kessler sutures  were placed at the free margin of the rotator cuff with #2 Fibrewire.  Using  these as retraction devices, we were able to further mobilize the free  margin of the rotator cuff.  There was noted to be some residual deficiency  in the rotator cuff coverage of the greater tuberosity and for this reason,  it was decided to utilize a Restore patch for additional augmentation of the  repair.  Residual soft tissue at the apex of the greater tuberosity was  removed with a rongeur, and then an osteotome was used to create a bony  cancellous trough at the apex of the greater tuberosity.  At this point, we  then inspected the glenohumeral joint.  There was noted to be some  chondromalacia on the humeral head and previous complete rupture of the  biceps tendon with some residual stump of the biceps in the joint.  A rongeur was introduced and used to debride the stump of the biceps from the  superior glenoid and in addition, there was a  very large degenerative tear  of the superior labrum, and it was also debrided and removed.  The  glenohumeral joint was then copiously irrigated.  A Concept medium-sized  tenaculum was then used to make a series of bone tunnels through the greater  tuberosity.  We used the suture retriever then to pass the suture limbs  through the bone tunnels.  The rotator cuff was then repaired through the  bone tunnels sequentially from posterior to anterior, allowing excellent  reapposition of the  free margin of the rotator cuff down to the apex of the  greater tuberosity.  There was some mild residual deficiency of the cuff  and, as such, we elected to use the Restore patch.  The Restore patch was  sutured into place circumferentially over the distal aspect of the rotator  cuff and the greater tuberosity and utilizing a series of simple 2-0 Vicryl  sutures.  Care was taken to make sure that the Restore patch was  appropriately tensioned, and we had hydrated the Restore patch prior to  implantation.  Once this was completed, we had inspected the repair, and  this showed good position with no evidence for excessive tension with the  arm down at the side.  Final irrigation was then performed.  The split in  the deltoid was then repaired with a series of interrupted figure-of-eight  #1 Vicryl sutures.  Then 2-0 Vicryl used for the subcu and intracuticular 3-  0 Monocryl used for the skin.  Steri-Strips and a dry dressing were then  taped over the left shoulder.  The left arm was placed into a DonJoy  UltraSling.  The patient was then placed supine, extubated, and taken to the  recovery room in stable condition.                                               Vania Rea. Supple, M.D.    KMS/MEDQ  D:  07/07/2002  T:  07/07/2002  Job:  [id number]

## 2010-11-27 ENCOUNTER — Other Ambulatory Visit: Payer: Self-pay | Admitting: Family Medicine

## 2010-11-27 DIAGNOSIS — Z1231 Encounter for screening mammogram for malignant neoplasm of breast: Secondary | ICD-10-CM

## 2010-12-23 ENCOUNTER — Ambulatory Visit
Admission: RE | Admit: 2010-12-23 | Discharge: 2010-12-23 | Disposition: A | Discharge status: Home / Self Care | Payer: Medicare Other | Source: Ambulatory Visit | Attending: Family Medicine | Admitting: Family Medicine

## 2010-12-23 DIAGNOSIS — Z1231 Encounter for screening mammogram for malignant neoplasm of breast: Secondary | ICD-10-CM

## 2011-01-16 ENCOUNTER — Ambulatory Visit: Payer: Worker's Compensation

## 2011-01-23 ENCOUNTER — Ambulatory Visit: Payer: Worker's Compensation

## 2011-03-03 ENCOUNTER — Ambulatory Visit: Payer: Worker's Compensation

## 2011-03-10 ENCOUNTER — Ambulatory Visit: Payer: Worker's Compensation

## 2011-11-18 ENCOUNTER — Other Ambulatory Visit: Payer: Self-pay | Admitting: Family Medicine

## 2011-11-18 DIAGNOSIS — Z1231 Encounter for screening mammogram for malignant neoplasm of breast: Secondary | ICD-10-CM

## 2011-12-24 ENCOUNTER — Ambulatory Visit
Admission: RE | Admit: 2011-12-24 | Discharge: 2011-12-24 | Disposition: A | Discharge status: Home / Self Care | Payer: Medicare Other | Source: Ambulatory Visit | Attending: Family Medicine | Admitting: Family Medicine

## 2011-12-24 DIAGNOSIS — Z1231 Encounter for screening mammogram for malignant neoplasm of breast: Secondary | ICD-10-CM

## 2013-02-15 ENCOUNTER — Other Ambulatory Visit: Payer: Self-pay

## 2013-02-15 DIAGNOSIS — Z1231 Encounter for screening mammogram for malignant neoplasm of breast: Secondary | ICD-10-CM

## 2013-03-09 ENCOUNTER — Ambulatory Visit: Payer: Medicare Other

## 2013-03-15 ENCOUNTER — Ambulatory Visit
Admission: RE | Admit: 2013-03-15 | Discharge: 2013-03-15 | Disposition: A | Discharge status: Home / Self Care | Payer: Self-pay | Source: Ambulatory Visit

## 2013-03-15 DIAGNOSIS — Z1231 Encounter for screening mammogram for malignant neoplasm of breast: Secondary | ICD-10-CM

## 2014-02-20 ENCOUNTER — Other Ambulatory Visit: Payer: Self-pay

## 2014-02-20 DIAGNOSIS — Z1231 Encounter for screening mammogram for malignant neoplasm of breast: Secondary | ICD-10-CM

## 2014-04-06 ENCOUNTER — Ambulatory Visit
Admission: RE | Admit: 2014-04-06 | Discharge: 2014-04-06 | Disposition: A | Discharge status: Home / Self Care | Payer: Medicare Other | Source: Ambulatory Visit

## 2014-04-06 DIAGNOSIS — Z1231 Encounter for screening mammogram for malignant neoplasm of breast: Secondary | ICD-10-CM

## 2015-03-07 ENCOUNTER — Emergency Department (HOSPITAL_COMMUNITY): Payer: Medicare Other

## 2015-03-07 ENCOUNTER — Emergency Department (HOSPITAL_COMMUNITY)
Admission: EM | Admit: 2015-03-07 | Discharge: 2015-03-07 | Disposition: A | Discharge status: Home / Self Care | Payer: Medicare Other | Attending: Emergency Medicine | Admitting: Emergency Medicine

## 2015-03-07 ENCOUNTER — Encounter (HOSPITAL_COMMUNITY): Payer: Self-pay

## 2015-03-07 DIAGNOSIS — R1084 Generalized abdominal pain: Secondary | ICD-10-CM | POA: Insufficient documentation

## 2015-03-07 DIAGNOSIS — R1012 Left upper quadrant pain: Secondary | ICD-10-CM | POA: Diagnosis not present

## 2015-03-07 DIAGNOSIS — Z9889 Other specified postprocedural states: Secondary | ICD-10-CM | POA: Insufficient documentation

## 2015-03-07 DIAGNOSIS — R112 Nausea with vomiting, unspecified: Secondary | ICD-10-CM | POA: Diagnosis not present

## 2015-03-07 DIAGNOSIS — Z880 Allergy status to penicillin: Secondary | ICD-10-CM | POA: Diagnosis not present

## 2015-03-07 DIAGNOSIS — R1032 Left lower quadrant pain: Secondary | ICD-10-CM | POA: Insufficient documentation

## 2015-03-07 DIAGNOSIS — Z8719 Personal history of other diseases of the digestive system: Secondary | ICD-10-CM | POA: Diagnosis not present

## 2015-03-07 DIAGNOSIS — Z90710 Acquired absence of both cervix and uterus: Secondary | ICD-10-CM | POA: Diagnosis not present

## 2015-03-07 DIAGNOSIS — R1033 Periumbilical pain: Secondary | ICD-10-CM | POA: Insufficient documentation

## 2015-03-07 DIAGNOSIS — Z79899 Other long term (current) drug therapy: Secondary | ICD-10-CM | POA: Diagnosis not present

## 2015-03-07 DIAGNOSIS — E876 Hypokalemia: Secondary | ICD-10-CM | POA: Insufficient documentation

## 2015-03-07 DIAGNOSIS — R1031 Right lower quadrant pain: Secondary | ICD-10-CM | POA: Diagnosis present

## 2015-03-07 HISTORY — DX: Gastro-esophageal reflux disease without esophagitis: K21.9

## 2015-03-07 HISTORY — DX: Diverticulitis of intestine, part unspecified, without perforation or abscess without bleeding: K57.92

## 2015-03-07 LAB — URINALYSIS, ROUTINE W REFLEX MICROSCOPIC
Bilirubin Urine: NEGATIVE
Glucose, UA: NEGATIVE mg/dL
Hgb urine dipstick: NEGATIVE
Ketones, ur: NEGATIVE mg/dL
LEUKOCYTES UA: NEGATIVE
NITRITE: NEGATIVE
PH: 7.5 (ref 5.0–8.0)
Protein, ur: NEGATIVE mg/dL
SPECIFIC GRAVITY, URINE: 1.016 (ref 1.005–1.030)

## 2015-03-07 LAB — CBC WITH DIFFERENTIAL/PLATELET
BASOS ABS: 0 10*3/uL (ref 0.0–0.1)
BASOS PCT: 0 %
Eosinophils Absolute: 0.1 10*3/uL (ref 0.0–0.7)
Eosinophils Relative: 2 %
HEMATOCRIT: 41.3 % (ref 36.0–46.0)
HEMOGLOBIN: 13.8 g/dL (ref 12.0–15.0)
Lymphocytes Relative: 16 %
Lymphs Abs: 0.8 10*3/uL (ref 0.7–4.0)
MCH: 29.9 pg (ref 26.0–34.0)
MCHC: 33.4 g/dL (ref 30.0–36.0)
MCV: 89.6 fL (ref 78.0–100.0)
Monocytes Absolute: 0.5 10*3/uL (ref 0.1–1.0)
Monocytes Relative: 9 %
NEUTROS ABS: 3.8 10*3/uL (ref 1.7–7.7)
NEUTROS PCT: 73 %
Platelets: 271 10*3/uL (ref 150–400)
RBC: 4.61 MIL/uL (ref 3.87–5.11)
RDW: 12.6 % (ref 11.5–15.5)
WBC: 5.2 10*3/uL (ref 4.0–10.5)

## 2015-03-07 LAB — I-STAT CG4 LACTIC ACID, ED
LACTIC ACID, VENOUS: 0.69 mmol/L (ref 0.5–2.0)
Lactic Acid, Venous: 2.81 mmol/L (ref 0.5–2.0)

## 2015-03-07 LAB — COMPREHENSIVE METABOLIC PANEL
ALBUMIN: 4.2 g/dL (ref 3.5–5.0)
ALT: 18 U/L (ref 14–54)
ANION GAP: 12 (ref 5–15)
AST: 26 U/L (ref 15–41)
Alkaline Phosphatase: 53 U/L (ref 38–126)
BILIRUBIN TOTAL: 0.9 mg/dL (ref 0.3–1.2)
BUN: 20 mg/dL (ref 6–20)
CO2: 30 mmol/L (ref 22–32)
Calcium: 9.7 mg/dL (ref 8.9–10.3)
Chloride: 101 mmol/L (ref 101–111)
Creatinine, Ser: 1 mg/dL (ref 0.44–1.00)
GFR calc Af Amer: >60 mL/min (ref >60)
GFR calc non Af Amer: 53 mL/min — ABNORMAL LOW (ref >60)
GLUCOSE: 123 mg/dL — AB (ref 65–99)
POTASSIUM: 2.8 mmol/L — AB (ref 3.5–5.1)
Sodium: 143 mmol/L (ref 135–145)
TOTAL PROTEIN: 8.2 g/dL — AB (ref 6.5–8.1)

## 2015-03-07 LAB — LIPASE, BLOOD: LIPASE: 38 U/L (ref 11–51)

## 2015-03-07 MED ORDER — SODIUM CHLORIDE 0.9 % IV BOLUS (SEPSIS)
1000.0000 mL | Freq: Once | INTRAVENOUS | Status: AC
  Administered 2015-03-07: 1000 mL via INTRAVENOUS

## 2015-03-07 MED ORDER — POTASSIUM CHLORIDE CRYS ER 20 MEQ PO TBCR
60.0000 meq | EXTENDED_RELEASE_TABLET | Freq: Once | ORAL | Status: AC
  Administered 2015-03-07: 60 meq via ORAL
  Filled 2015-03-07: qty 3

## 2015-03-07 MED ORDER — IOHEXOL 300 MG/ML  SOLN
100.0000 mL | Freq: Once | INTRAMUSCULAR | Status: AC | PRN
  Administered 2015-03-07: 100 mL via INTRAVENOUS

## 2015-03-07 MED ORDER — POTASSIUM CHLORIDE 10 MEQ/100ML IV SOLN
10.0000 meq | INTRAVENOUS | Status: AC
  Administered 2015-03-07 (×2): 10 meq via INTRAVENOUS
  Filled 2015-03-07 (×2): qty 100

## 2015-03-07 MED ORDER — ONDANSETRON 4 MG PO TBDP: 4.0000 mg | ORAL_TABLET | Freq: Three times a day (TID) | ORAL | Status: DC | PRN

## 2015-03-07 MED ORDER — ONDANSETRON HCL 4 MG/2ML IJ SOLN
4.0000 mg | Freq: Once | INTRAMUSCULAR | Status: AC
  Administered 2015-03-07: 4 mg via INTRAVENOUS
  Filled 2015-03-07: qty 2

## 2015-03-07 NOTE — ED Provider Notes (Signed)
CSN: [id number]     Arrival date & time 03/07/15  1009 History   First MD Initiated Contact with Patient 03/07/15 1035     Chief Complaint  Patient presents with  . Abdominal Pain     (Consider location/radiation/quality/duration/timing/severity/associated sxs/prior Treatment) HPI Comments: 81 year old female with past medical history including GERD, diverticulitis s/p resection who presents with abdominal pain and vomiting. The patient woke up this morning and had a few episodes of vomiting. She reports right lower quadrant abdominal pain that also began this morning. The pain is mild at rest and is worse when she moves. She states she has been constipated recently and took a stool softener last night. She was able to have a bowel movement here upon arrival to the ED. She denies any fevers, cough/cold symptoms, or urinary symptoms.  Patient is a 81 y.o. female presenting with abdominal pain. The history is provided by the patient.  Abdominal Pain   Past Medical History  Diagnosis Date  . Diverticulitis   . GERD (gastroesophageal reflux disease)    Past Surgical History  Procedure Laterality Date  . Abdominal surgery    . Abdominal hysterectomy     Family History  Problem Relation Age of Onset  . Heart failure Father    Social History  Substance Use Topics  . Smoking status: Never Smoker   . Smokeless tobacco: Never Used  . Alcohol Use: No   OB History    No data available     Review of Systems  Gastrointestinal: Positive for abdominal pain.   10 Systems reviewed and are negative for acute change except as noted in the HPI.    Allergies  Penicillins  Home Medications   Prior to Admission medications   Medication Sig Start Date End Date Taking? Authorizing Provider  acetaminophen (TYLENOL) 325 MG tablet Take 650 mg by mouth every 6 (six) hours as needed for moderate pain or headache.   Yes Historical Provider, MD  alendronate (FOSAMAX) 70 MG tablet Take 70 mg by  mouth once a week. Take with a full glass of water on an empty stomach.   Yes Historical Provider, MD  hydrochlorothiazide (HYDRODIURIL) 25 MG tablet Take 25 mg by mouth every morning.   Yes Historical Provider, MD  iron polysaccharides (NIFEREX) 150 MG capsule Take 150 mg by mouth daily.   Yes Historical Provider, MD  pravastatin (PRAVACHOL) 40 MG tablet Take 40 mg by mouth at bedtime.   Yes Historical Provider, MD  ondansetron (ZOFRAN ODT) 4 MG disintegrating tablet Take 1 tablet (4 mg total) by mouth every 8 (eight) hours as needed for nausea or vomiting. 03/07/15   Sharlett Iles, MD   BP 135/82 mmHg  Pulse 71  Temp(Src) 98 F (36.7 C) (Oral)  Resp 16  SpO2 100% Physical Exam  Constitutional: She is oriented to person, place, and time. She appears well-developed and well-nourished. No distress.  HENT:  Head: Normocephalic and atraumatic.  Moist mucous membranes  Eyes: Conjunctivae are normal. Pupils are equal, round, and reactive to light.  Neck: Neck supple.  Cardiovascular: Normal rate, regular rhythm and normal heart sounds.   No murmur heard. Pulmonary/Chest: Effort normal and breath sounds normal.  Abdominal: Soft. Bowel sounds are normal. She exhibits no distension. There is no rebound and no guarding.  Generalized TTP worst in LUQ, LLQ, and periumbilical abd  Musculoskeletal: She exhibits no edema.  Neurological: She is alert and oriented to person, place, and time.  Fluent speech  Skin:  Skin is warm and dry.  Psychiatric: She has a normal mood and affect. Judgment normal.  Nursing note and vitals reviewed.   ED Course  Procedures (including critical care time) Labs Review Labs Reviewed  COMPREHENSIVE METABOLIC PANEL - Abnormal; Notable for the following:    Potassium 2.8 (*)    Glucose, Bld 123 (*)    Total Protein 8.2 (*)    GFR calc non Af Amer 53 (*)    All other components within normal limits  I-STAT CG4 LACTIC ACID, ED - Abnormal; Notable for the  following:    Lactic Acid, Venous 2.81 (*)    All other components within normal limits  LIPASE, BLOOD  CBC WITH DIFFERENTIAL/PLATELET  URINALYSIS, ROUTINE W REFLEX MICROSCOPIC (NOT AT [facility])  I-STAT CG4 LACTIC ACID, ED    Imaging Review Ct Abdomen Pelvis W Contrast  03/07/2015  CLINICAL DATA:  Abdominal pain.  Constipation.  Nausea and vomiting. EXAM: CT ABDOMEN AND PELVIS WITH CONTRAST TECHNIQUE: Multidetector CT imaging of the abdomen and pelvis was performed using the standard protocol following bolus administration of intravenous contrast. CONTRAST:  141mL OMNIPAQUE IOHEXOL 300 MG/ML  SOLN COMPARISON:  11/04/2008 FINDINGS: Liver, gallbladder, spleen, pancreas are within normal limits. Bilateral adrenal calcifications are present without mass. This is stable. Simple cyst in the right kidney.  Simple cyst in the left kidney. There is contrast reflux in the ovarian veins. No obvious pelvic varices. Appendix is nonvisualized. Sigmoid diverticulosis without evidence of acute diverticulitis. Bladder is mildly distended. No evidence of bladder wall thickening. Lumbar degenerative disc disease. There is an element of spinal stenosis at L4-5. IMPRESSION: No acute intra-abdominal or intrapelvic pathology. Chronic changes noted. Electronically Signed   By: Marybelle Killings M.D.   On: 03/07/2015 13:23   I have personally reviewed and evaluated these lab results as part of my medical decision-making.   EKG Interpretation None     Medications  potassium chloride 10 mEq in 100 mL IVPB (10 mEq Intravenous New Bag/Given 03/07/15 1610)  ondansetron (ZOFRAN) injection 4 mg (4 mg Intravenous Given 03/07/15 1103)  sodium chloride 0.9 % bolus 1,000 mL (0 mLs Intravenous Stopped 03/07/15 1325)  iohexol (OMNIPAQUE) 300 MG/ML solution 100 mL (100 mLs Intravenous Contrast Given 03/07/15 1251)  potassium chloride SA (K-DUR,KLOR-CON) CR tablet 60 mEq (60 mEq Oral Given 03/07/15 1454)  sodium chloride 0.9 % bolus 1,000 mL (0  mLs Intravenous Stopped 03/07/15 1613)    MDM   Final diagnoses:  Non-intractable vomiting with nausea, vomiting of unspecified type  Generalized abdominal pain  Hypokalemia   PT w/ abdominal pain assoc w/ vomiting that began this morning. On exam, she was nontoxic in appearance with normal vital signs. She had generalized tenderness to palpation, worse in left sided and periumbilical abdomen. Gave the patient Zofran and an IV fluid bolus and obtained above lab work including lactate. Labs notable for lactate 2.81, potassium 2.8, normal CBC. Because of the patient's elevated lactate and abdominal tenderness, obtained a CT to evaluate for acute process. CT was unremarkable. Gave the patient IV and oral potassium repletion. On reexamination, the patient appeared comfortable and stated that she felt much improved. She requested a sandwich and soda. Repeat lactate is normal. I suspect her lactic acidosis may have been due to dehydration from vomiting. I'm reassured by her well appearance and normal imaging. I have discussed supportive care instructions at home and importance of close PCP follow-up regarding her symptoms as well as follow-up for repeat potassium level. The  patient will be discharged after the completion of her IV potassium run. Patient voiced understanding of return precautions.   Sharlett Iles, MD 03/07/15 [phone]

## 2015-03-07 NOTE — Discharge Instructions (Signed)

## 2015-03-07 NOTE — ED Notes (Signed)
Pt ambulated to RR with minimal assistance 

## 2015-03-07 NOTE — ED Notes (Signed)
Bed: WA01 Expected date:  Expected time:  Means of arrival:  Comments: EMS- 81yo F, abdominal pain/emesis

## 2015-03-07 NOTE — ED Notes (Signed)
Per EMS- Patient c/o RLQ pain. Patient has a history of diverticulitis. Patient ambulated to the stretcher with moderate assistance.

## 2015-03-07 NOTE — ED Notes (Signed)
Tolerated po fluids

## 2015-03-07 NOTE — ED Notes (Signed)
Pt made aware of urine sample. Had BM upon arrival.

## 2015-03-07 NOTE — ED Notes (Signed)
Patient reports that she has been constipated and took a stool softener last night. Today she wakes with N/V, mid lower abdominal pain and she had a constipated stool upon arrival to the ED.

## 2015-03-07 NOTE — ED Notes (Signed)
Pt

## 2015-03-07 NOTE — ED Notes (Signed)
Patient given water for fluid challenge.

## 2015-03-20 ENCOUNTER — Other Ambulatory Visit: Payer: Self-pay

## 2015-03-20 DIAGNOSIS — Z1231 Encounter for screening mammogram for malignant neoplasm of breast: Secondary | ICD-10-CM

## 2015-04-12 ENCOUNTER — Ambulatory Visit
Admission: RE | Admit: 2015-04-12 | Discharge: 2015-04-12 | Disposition: A | Discharge status: Home / Self Care | Payer: Medicare Other | Source: Ambulatory Visit

## 2015-04-12 DIAGNOSIS — Z1231 Encounter for screening mammogram for malignant neoplasm of breast: Secondary | ICD-10-CM

## 2015-11-01 ENCOUNTER — Other Ambulatory Visit: Payer: Self-pay | Admitting: Family Medicine

## 2015-11-01 ENCOUNTER — Ambulatory Visit
Admission: RE | Admit: 2015-11-01 | Discharge: 2015-11-01 | Disposition: A | Discharge status: Home / Self Care | Payer: Medicare Other | Source: Ambulatory Visit | Attending: Family Medicine | Admitting: Family Medicine

## 2015-11-01 DIAGNOSIS — R1032 Left lower quadrant pain: Secondary | ICD-10-CM

## 2015-11-01 MED ORDER — IOPAMIDOL (ISOVUE-300) INJECTION 61%: 100.0000 mL | Freq: Once | INTRAVENOUS | Status: DC | PRN

## 2017-01-16 ENCOUNTER — Ambulatory Visit
Admission: RE | Admit: 2017-01-16 | Discharge: 2017-01-16 | Disposition: A | Discharge status: Home / Self Care | Payer: Medicare Other | Source: Ambulatory Visit | Attending: Family Medicine | Admitting: Family Medicine

## 2017-01-16 ENCOUNTER — Other Ambulatory Visit: Payer: Self-pay | Admitting: Family Medicine

## 2017-01-16 DIAGNOSIS — M545 Low back pain: Secondary | ICD-10-CM

## 2017-02-11 ENCOUNTER — Emergency Department (HOSPITAL_BASED_OUTPATIENT_CLINIC_OR_DEPARTMENT_OTHER)
Admission: EM | Admit: 2017-02-11 | Discharge: 2017-02-11 | Disposition: A | Discharge status: Home / Self Care | Payer: Medicare Other | Attending: Emergency Medicine | Admitting: Emergency Medicine

## 2017-02-11 ENCOUNTER — Other Ambulatory Visit: Payer: Self-pay

## 2017-02-11 ENCOUNTER — Emergency Department (HOSPITAL_BASED_OUTPATIENT_CLINIC_OR_DEPARTMENT_OTHER): Payer: Medicare Other

## 2017-02-11 ENCOUNTER — Encounter (HOSPITAL_BASED_OUTPATIENT_CLINIC_OR_DEPARTMENT_OTHER): Payer: Self-pay | Admitting: *Deleted

## 2017-02-11 DIAGNOSIS — Z79899 Other long term (current) drug therapy: Secondary | ICD-10-CM | POA: Insufficient documentation

## 2017-02-11 DIAGNOSIS — R109 Unspecified abdominal pain: Secondary | ICD-10-CM | POA: Diagnosis not present

## 2017-02-11 DIAGNOSIS — R112 Nausea with vomiting, unspecified: Secondary | ICD-10-CM | POA: Diagnosis not present

## 2017-02-11 DIAGNOSIS — Z7902 Long term (current) use of antithrombotics/antiplatelets: Secondary | ICD-10-CM | POA: Insufficient documentation

## 2017-02-11 HISTORY — DX: Essential (primary) hypertension: I10

## 2017-02-11 LAB — URINALYSIS, ROUTINE W REFLEX MICROSCOPIC
Bilirubin Urine: NEGATIVE
Glucose, UA: NEGATIVE mg/dL
Hgb urine dipstick: NEGATIVE
Ketones, ur: NEGATIVE mg/dL
Leukocytes, UA: NEGATIVE
Nitrite: NEGATIVE
Protein, ur: NEGATIVE mg/dL
Specific Gravity, Urine: 1.02 (ref 1.005–1.030)
pH: 6 (ref 5.0–8.0)

## 2017-02-11 LAB — COMPREHENSIVE METABOLIC PANEL
ALT: 17 U/L (ref 14–54)
AST: 25 U/L (ref 15–41)
Albumin: 4 g/dL (ref 3.5–5.0)
Alkaline Phosphatase: 52 U/L (ref 38–126)
Anion gap: 12 (ref 5–15)
BUN: 10 mg/dL (ref 6–20)
CO2: 26 mmol/L (ref 22–32)
Calcium: 9.5 mg/dL (ref 8.9–10.3)
Chloride: 102 mmol/L (ref 101–111)
Creatinine, Ser: 0.96 mg/dL (ref 0.44–1.00)
GFR calc Af Amer: >60 mL/min (ref >60)
GFR calc non Af Amer: 54 mL/min — ABNORMAL LOW (ref >60)
Glucose, Bld: 94 mg/dL (ref 65–99)
Potassium: 3.8 mmol/L (ref 3.5–5.1)
Sodium: 140 mmol/L (ref 135–145)
Total Bilirubin: 0.7 mg/dL (ref 0.3–1.2)
Total Protein: 8.3 g/dL — ABNORMAL HIGH (ref 6.5–8.1)

## 2017-02-11 LAB — CBC WITH DIFFERENTIAL/PLATELET
Basophils Absolute: 0 10*3/uL (ref 0.0–0.1)
Basophils Relative: 0 %
Eosinophils Absolute: 0.1 10*3/uL (ref 0.0–0.7)
Eosinophils Relative: 2 %
HCT: 41.8 % (ref 36.0–46.0)
Hemoglobin: 14.2 g/dL (ref 12.0–15.0)
Lymphocytes Relative: 28 %
Lymphs Abs: 1.6 10*3/uL (ref 0.7–4.0)
MCH: 29.6 pg (ref 26.0–34.0)
MCHC: 34 g/dL (ref 30.0–36.0)
MCV: 87.3 fL (ref 78.0–100.0)
Monocytes Absolute: 0.7 10*3/uL (ref 0.1–1.0)
Monocytes Relative: 12 %
Neutro Abs: 3.3 10*3/uL (ref 1.7–7.7)
Neutrophils Relative %: 58 %
Platelets: 312 10*3/uL (ref 150–400)
RBC: 4.79 MIL/uL (ref 3.87–5.11)
RDW: 14 % (ref 11.5–15.5)
WBC: 5.7 10*3/uL (ref 4.0–10.5)

## 2017-02-11 LAB — LIPASE, BLOOD: Lipase: 29 U/L (ref 11–51)

## 2017-02-11 MED ORDER — ONDANSETRON HCL 4 MG/2ML IJ SOLN
INTRAMUSCULAR | Status: AC
  Administered 2017-02-11: 4 mg
  Filled 2017-02-11: qty 2

## 2017-02-11 MED ORDER — IOPAMIDOL (ISOVUE-300) INJECTION 61%
100.0000 mL | Freq: Once | INTRAVENOUS | Status: AC | PRN
  Administered 2017-02-11: 100 mL via INTRAVENOUS

## 2017-02-11 MED ORDER — TRAMADOL HCL 50 MG PO TABS: 50.0000 mg | ORAL_TABLET | Freq: Four times a day (QID) | ORAL | 0 refills | Status: DC | PRN

## 2017-02-11 MED ORDER — HYDROMORPHONE HCL 1 MG/ML IJ SOLN
0.7500 mg | Freq: Once | INTRAMUSCULAR | Status: AC
  Administered 2017-02-11: 0.75 mg via INTRAVENOUS
  Filled 2017-02-11: qty 1

## 2017-02-11 MED ORDER — SODIUM CHLORIDE 0.9 % IV SOLN
INTRAVENOUS | Status: DC
  Administered 2017-02-11: 12:00:00 via INTRAVENOUS

## 2017-02-11 MED FILL — traMADol HCL 50 MG TABS: 50 | 4 days supply | Qty: 15 | Fill #0

## 2017-02-11 NOTE — ED Triage Notes (Signed)
Pt reports "a lot of gas" with belching and passing flatus x last night, one episode of vomiting water and loose stool this am. Denies any abd pain, reports constant nausea x last night.

## 2017-02-11 NOTE — ED Triage Notes (Signed)
Patient states at this time that she had nausea last night and she is now having left sided flank to hip pain to her lower back. Denies any radiation to her leg. Patinet winces when she is sat up to move and walks with a limp. Denies any N/V or gas at this present time.

## 2017-02-11 NOTE — ED Notes (Signed)
Pt teaching provided on medications that may cause drowsiness. Pt instructed not to drive or operate heavy machinery while taking the prescribed medication. Pt verbalized understanding.   

## 2017-02-11 NOTE — ED Provider Notes (Signed)
[facility] EMERGENCY DEPARTMENT Provider Note   CSN: [id number] Arrival date & time: 02/11/17  0857     History   Chief Complaint Chief Complaint  Patient presents with  . Back Pain    HPI [name] is a 83 y.o. female.  HPI   83 year old female with left flank pain.  Onset yesterday & persistent since then.  She first noticed the pain when eating.  Describes the pain is sharp.  Mild pain at rest.  Significantly worse with movement.  She noticed that she was burping frequently yesterday.  Nausea and vomited once earlier this morning and had one loose stool.  No urinary complaints.  No vaginal bleeding or discharge.  She states that she has had a hysterectomy.  Denies any past history of kidney stones.  Does have a history of diverticulitis. Past Medical History:  Diagnosis Date  . Diverticulitis   . GERD (gastroesophageal reflux disease)     There are no active problems to display for this patient.   Past Surgical History:  Procedure Laterality Date  . ABDOMINAL HYSTERECTOMY    . ABDOMINAL SURGERY      OB History    No data available       Home Medications    Prior to Admission medications   Medication Sig Start Date End Date Taking? Authorizing Provider  acetaminophen (TYLENOL) 325 MG tablet Take 650 mg by mouth every 6 (six) hours as needed for moderate pain or headache.    [provider]  alendronate (FOSAMAX) 70 MG tablet Take 70 mg by mouth once a week. Take with a full glass of water on an empty stomach.    [provider]  hydrochlorothiazide (HYDRODIURIL) 25 MG tablet Take 25 mg by mouth every morning.    [provider]  iron polysaccharides (NIFEREX) 150 MG capsule Take 150 mg by mouth daily.    [provider]  ondansetron (ZOFRAN ODT) 4 MG disintegrating tablet Take 1 tablet (4 mg total) by mouth every 8 (eight) hours as needed for nausea or vomiting. 03/07/15   Little, Wenda Overland, MD  pravastatin  (PRAVACHOL) 40 MG tablet Take 40 mg by mouth at bedtime.    [provider]    Family History Family History  Problem Relation Age of Onset  . Heart failure Father     Social History Social History   Tobacco Use  . Smoking status: Never Smoker  . Smokeless tobacco: Never Used  Substance Use Topics  . Alcohol use: No  . Drug use: No     Allergies   Penicillins   Review of Systems Review of Systems All systems reviewed and negative, other than as noted in HPI.   Physical Exam Updated Vital Signs BP (!) 141/70 (BP Location: Left Arm)   Pulse 60   Temp 99.2 F (37.3 C) (Oral)   Resp 18   Ht 5' (1.524 m)   Wt 61.7 kg (136 lb)   SpO2 100%   BMI 26.56 kg/m   Physical Exam  Constitutional: She appears well-developed and well-nourished. No distress.  HENT:  Head: Normocephalic and atraumatic.  Eyes: Conjunctivae are normal. Right eye exhibits no discharge. Left eye exhibits no discharge.  Neck: Neck supple.  Cardiovascular: Normal rate, regular rhythm and normal heart sounds. Exam reveals no gallop and no friction rub.  No murmur heard. Pulmonary/Chest: Effort normal and breath sounds normal. No respiratory distress.  Abdominal: Soft. She exhibits no distension. There is tenderness.  Significant tenderness in the left upper and left lower quadrants with voluntary guarding.  There is no rebound.  She does not really seem distended.  Musculoskeletal: She exhibits no edema or tenderness.  Neurological: She is alert.  Skin: Skin is warm and dry.  Psychiatric: She has a normal mood and affect. Her behavior is normal. Thought content normal.  Nursing note and vitals reviewed.    ED Treatments / Results  Labs (all labs ordered are listed, but only abnormal results are displayed) Labs Reviewed  COMPREHENSIVE METABOLIC PANEL - Abnormal; Notable for the following components:      Result Value   Total Protein 8.3 (*)    GFR calc non Af Amer 54 (*)    All  other components within normal limits  URINE CULTURE  URINALYSIS, ROUTINE W REFLEX MICROSCOPIC  CBC WITH DIFFERENTIAL/PLATELET  LIPASE, BLOOD    EKG  EKG Interpretation None       Radiology Ct Abdomen Pelvis W Contrast  Result Date: 02/11/2017 CLINICAL DATA:  Left-sided abdominal pain and vomiting since yesterday. EXAM: CT ABDOMEN AND PELVIS WITH CONTRAST TECHNIQUE: Multidetector CT imaging of the abdomen and pelvis was performed using the standard protocol following bolus administration of intravenous contrast. CONTRAST:  130mL ISOVUE-300 IOPAMIDOL (ISOVUE-300) INJECTION 61% COMPARISON:  11/01/2015 FINDINGS: Lower chest: Mild motion and subsegmental atelectasis in the lung bases. No pleural effusion. Normal heart size. Hepatobiliary: Diffusely decreased attenuation of the liver consistent with steatosis. No focal liver abnormality identified. Suspected tiny layering stones in the gallbladder. No gross gallbladder wall thickening or pericholecystic inflammation. No biliary dilatation. Pancreas: Similar slight prominence of the pancreatic duct in the head. No inflammatory change. Spleen: Unremarkable. Adrenals/Urinary Tract: Mild chronic calcification in both adrenal glands without mass. Unchanged 4.0 cm right renal cyst and 9 mm left renal cyst. No evidence of renal calculi or hydronephrosis. Unremarkable bladder. Stomach/Bowel: The stomach is within normal limits. There is no evidence of bowel obstruction. Diffuse colonic diverticulosis is again noted without evidence of acute diverticulitis. The appendix is not identified and may be absent. Vascular/Lymphatic: Mild abdominal aortic atherosclerosis without aneurysm. No enlarged lymph nodes. Reproductive: Status post hysterectomy. No adnexal masses. Other: No intraperitoneal free fluid. No abdominal wall mass or hernia. Musculoskeletal: Thoracolumbar spondylosis. Unchanged mild chronic anterior wedging of the L5 vertebral body. Advanced lumbar facet  arthrosis. IMPRESSION: 1. No acute abnormality identified in the abdomen or pelvis. 2. Colonic diverticulosis without evidence of diverticulitis. 3. Hepatic steatosis. 4. Suspected cholelithiasis. 5.  Aortic Atherosclerosis (ICD10-I70.0). Electronically Signed   By: Logan Bores M.D.   On: 02/11/2017 14:06    Procedures Procedures (including critical care time)  Medications Ordered in ED Medications  0.9 %  sodium chloride infusion ( Intravenous New Bag/Given 02/11/17 1221)  HYDROmorphone (DILAUDID) injection 0.75 mg (0.75 mg Intravenous Given 02/11/17 1218)  ondansetron (ZOFRAN) 4 MG/2ML injection (4 mg  Given 02/11/17 1219)     Initial Impression / Assessment and Plan / ED Course  I have reviewed the triage vital signs and the nursing notes.  Pertinent labs & imaging results that were available during my care of the patient were reviewed by me and considered in my medical decision making (see chart for details).     83 year old female with left flank/left-sided abdominal pain.  Suspect she may have diverticulitis.  No specific urinary complaints.  I think kidney stone is less likely.  Will check labs, urinalysis, treat symptoms and obtained  CT the abdomen and pelvis.  W/u without  clear etiology. Cholelithiasis doesn't explain symptoms. Musculoskeletal? I doubt emergent process.  Plan continue systematic treatment.  Detailed return precautions with her and family at bedside.  Outpatient follow-up as needed otherwise.  Final Clinical Impressions(s) / ED Diagnoses   Final diagnoses:  Left flank pain    ED Discharge Orders    None       Virgel Manifold, MD 02/11/17 [phone]

## 2017-02-12 LAB — URINE CULTURE: Culture: NO GROWTH

## 2018-01-01 ENCOUNTER — Ambulatory Visit
Admission: RE | Admit: 2018-01-01 | Discharge: 2018-01-01 | Disposition: A | Discharge status: Home / Self Care | Payer: Medicare Other | Source: Ambulatory Visit | Attending: Physician Assistant | Admitting: Physician Assistant

## 2018-01-01 ENCOUNTER — Other Ambulatory Visit: Payer: Self-pay | Admitting: Physician Assistant

## 2018-01-01 DIAGNOSIS — M79674 Pain in right toe(s): Secondary | ICD-10-CM

## 2019-08-17 ENCOUNTER — Emergency Department (HOSPITAL_COMMUNITY)
Admission: EM | Admit: 2019-08-17 | Discharge: 2019-08-18 | Discharge status: Absent without leave | Payer: Medicaid Other

## 2019-08-17 ENCOUNTER — Other Ambulatory Visit: Payer: Self-pay

## 2019-09-29 ENCOUNTER — Other Ambulatory Visit: Payer: Self-pay | Admitting: Nurse Practitioner

## 2019-09-29 ENCOUNTER — Ambulatory Visit
Admission: RE | Admit: 2019-09-29 | Discharge: 2019-09-29 | Disposition: A | Discharge status: Home / Self Care | Payer: No Typology Code available for payment source | Source: Ambulatory Visit | Attending: Nurse Practitioner | Admitting: Nurse Practitioner

## 2019-09-29 DIAGNOSIS — M25559 Pain in unspecified hip: Secondary | ICD-10-CM

## 2019-09-30 ENCOUNTER — Other Ambulatory Visit: Payer: Self-pay | Admitting: Nurse Practitioner

## 2019-09-30 DIAGNOSIS — R296 Repeated falls: Secondary | ICD-10-CM

## 2019-09-30 DIAGNOSIS — H539 Unspecified visual disturbance: Secondary | ICD-10-CM

## 2019-09-30 DIAGNOSIS — R4189 Other symptoms and signs involving cognitive functions and awareness: Secondary | ICD-10-CM

## 2019-10-14 ENCOUNTER — Ambulatory Visit
Admission: RE | Admit: 2019-10-14 | Discharge: 2019-10-14 | Disposition: A | Discharge status: Home / Self Care | Payer: Medicare (Managed Care) | Source: Ambulatory Visit | Attending: Nurse Practitioner | Admitting: Nurse Practitioner

## 2019-10-14 DIAGNOSIS — H539 Unspecified visual disturbance: Secondary | ICD-10-CM

## 2019-10-14 DIAGNOSIS — R4189 Other symptoms and signs involving cognitive functions and awareness: Secondary | ICD-10-CM

## 2019-10-14 DIAGNOSIS — R296 Repeated falls: Secondary | ICD-10-CM

## 2019-10-23 ENCOUNTER — Emergency Department (HOSPITAL_COMMUNITY): Payer: Medicare (Managed Care)

## 2019-10-23 ENCOUNTER — Inpatient Hospital Stay (HOSPITAL_COMMUNITY)
Admission: EM | Admit: 2019-10-23 | Discharge: 2019-10-25 | DRG: 374 | Disposition: A | Discharge status: Hospice | Payer: Medicare (Managed Care) | Attending: Internal Medicine | Admitting: Internal Medicine

## 2019-10-23 DIAGNOSIS — I10 Essential (primary) hypertension: Secondary | ICD-10-CM | POA: Diagnosis present

## 2019-10-23 DIAGNOSIS — K219 Gastro-esophageal reflux disease without esophagitis: Secondary | ICD-10-CM | POA: Diagnosis present

## 2019-10-23 DIAGNOSIS — N390 Urinary tract infection, site not specified: Secondary | ICD-10-CM | POA: Diagnosis present

## 2019-10-23 DIAGNOSIS — C762 Malignant neoplasm of abdomen: Secondary | ICD-10-CM | POA: Diagnosis present

## 2019-10-23 DIAGNOSIS — R1902 Left upper quadrant abdominal swelling, mass and lump: Secondary | ICD-10-CM

## 2019-10-23 DIAGNOSIS — F0390 Unspecified dementia without behavioral disturbance: Secondary | ICD-10-CM | POA: Diagnosis present

## 2019-10-23 DIAGNOSIS — K5900 Constipation, unspecified: Secondary | ICD-10-CM

## 2019-10-23 DIAGNOSIS — N179 Acute kidney failure, unspecified: Secondary | ICD-10-CM | POA: Diagnosis present

## 2019-10-23 DIAGNOSIS — Z90710 Acquired absence of both cervix and uterus: Secondary | ICD-10-CM | POA: Diagnosis not present

## 2019-10-23 DIAGNOSIS — Z79899 Other long term (current) drug therapy: Secondary | ICD-10-CM | POA: Diagnosis not present

## 2019-10-23 DIAGNOSIS — K562 Volvulus: Secondary | ICD-10-CM | POA: Diagnosis present

## 2019-10-23 DIAGNOSIS — Z66 Do not resuscitate: Secondary | ICD-10-CM | POA: Diagnosis present

## 2019-10-23 DIAGNOSIS — Z20822 Contact with and (suspected) exposure to covid-19: Secondary | ICD-10-CM | POA: Diagnosis present

## 2019-10-23 DIAGNOSIS — R591 Generalized enlarged lymph nodes: Secondary | ICD-10-CM | POA: Diagnosis present

## 2019-10-23 DIAGNOSIS — C786 Secondary malignant neoplasm of retroperitoneum and peritoneum: Secondary | ICD-10-CM | POA: Diagnosis present

## 2019-10-23 DIAGNOSIS — Z515 Encounter for palliative care: Secondary | ICD-10-CM

## 2019-10-23 DIAGNOSIS — K5289 Other specified noninfective gastroenteritis and colitis: Secondary | ICD-10-CM | POA: Diagnosis present

## 2019-10-23 DIAGNOSIS — Z880 Allergy status to penicillin: Secondary | ICD-10-CM

## 2019-10-23 DIAGNOSIS — E860 Dehydration: Secondary | ICD-10-CM | POA: Diagnosis present

## 2019-10-23 DIAGNOSIS — R109 Unspecified abdominal pain: Secondary | ICD-10-CM | POA: Diagnosis not present

## 2019-10-23 DIAGNOSIS — Z8249 Family history of ischemic heart disease and other diseases of the circulatory system: Secondary | ICD-10-CM | POA: Diagnosis not present

## 2019-10-23 DIAGNOSIS — N2889 Other specified disorders of kidney and ureter: Secondary | ICD-10-CM | POA: Diagnosis present

## 2019-10-23 DIAGNOSIS — R55 Syncope and collapse: Secondary | ICD-10-CM | POA: Diagnosis present

## 2019-10-23 LAB — CBC WITH DIFFERENTIAL/PLATELET
Abs Immature Granulocytes: 0.04 10*3/uL (ref 0.00–0.07)
Basophils Absolute: 0.1 10*3/uL (ref 0.0–0.1)
Basophils Relative: 1 %
Eosinophils Absolute: 0.1 10*3/uL (ref 0.0–0.5)
Eosinophils Relative: 2 %
HCT: 35.4 % — ABNORMAL LOW (ref 36.0–46.0)
Hemoglobin: 11.7 g/dL — ABNORMAL LOW (ref 12.0–15.0)
Immature Granulocytes: 1 %
Lymphocytes Relative: 13 %
Lymphs Abs: 0.9 10*3/uL (ref 0.7–4.0)
MCH: 30.1 pg (ref 26.0–34.0)
MCHC: 33.1 g/dL (ref 30.0–36.0)
MCV: 91 fL (ref 80.0–100.0)
Monocytes Absolute: 0.9 10*3/uL (ref 0.1–1.0)
Monocytes Relative: 13 %
Neutro Abs: 5 10*3/uL (ref 1.7–7.7)
Neutrophils Relative %: 70 %
Platelets: 397 10*3/uL (ref 150–400)
RBC: 3.89 MIL/uL (ref 3.87–5.11)
RDW: 13.7 % (ref 11.5–15.5)
WBC: 7 10*3/uL (ref 4.0–10.5)
nRBC: 0 % (ref 0.0–0.2)

## 2019-10-23 LAB — COMPREHENSIVE METABOLIC PANEL
ALT: 8 U/L (ref 0–44)
AST: 32 U/L (ref 15–41)
Albumin: 3.3 g/dL — ABNORMAL LOW (ref 3.5–5.0)
Alkaline Phosphatase: 40 U/L (ref 38–126)
Anion gap: 12 (ref 5–15)
BUN: 22 mg/dL (ref 8–23)
CO2: 32 mmol/L (ref 22–32)
Calcium: 12.3 mg/dL — ABNORMAL HIGH (ref 8.9–10.3)
Chloride: 101 mmol/L (ref 98–111)
Creatinine, Ser: 1.12 mg/dL — ABNORMAL HIGH (ref 0.44–1.00)
GFR calc Af Amer: 53 mL/min — ABNORMAL LOW (ref >60)
GFR calc non Af Amer: 45 mL/min — ABNORMAL LOW (ref >60)
Glucose, Bld: 100 mg/dL — ABNORMAL HIGH (ref 70–99)
Potassium: 3.9 mmol/L (ref 3.5–5.1)
Sodium: 145 mmol/L (ref 135–145)
Total Bilirubin: 0.7 mg/dL (ref 0.3–1.2)
Total Protein: 6.8 g/dL (ref 6.5–8.1)

## 2019-10-23 LAB — URINALYSIS, ROUTINE W REFLEX MICROSCOPIC
Bilirubin Urine: NEGATIVE
Glucose, UA: NEGATIVE mg/dL
Hgb urine dipstick: NEGATIVE
Ketones, ur: NEGATIVE mg/dL
Nitrite: NEGATIVE
Protein, ur: NEGATIVE mg/dL
Specific Gravity, Urine: 1.013 (ref 1.005–1.030)
WBC, UA: >50 WBC/hpf — ABNORMAL HIGH (ref 0–5)
pH: 6 (ref 5.0–8.0)

## 2019-10-23 LAB — I-STAT CHEM 8, ED
BUN: 23 mg/dL (ref 8–23)
Calcium, Ion: 1.56 mmol/L (ref 1.15–1.40)
Chloride: 102 mmol/L (ref 98–111)
Creatinine, Ser: 1.2 mg/dL — ABNORMAL HIGH (ref 0.44–1.00)
Glucose, Bld: 92 mg/dL (ref 70–99)
HCT: 35 % — ABNORMAL LOW (ref 36.0–46.0)
Hemoglobin: 11.9 g/dL — ABNORMAL LOW (ref 12.0–15.0)
Potassium: 3.7 mmol/L (ref 3.5–5.1)
Sodium: 143 mmol/L (ref 135–145)
TCO2: 33 mmol/L — ABNORMAL HIGH (ref 22–32)

## 2019-10-23 LAB — SARS CORONAVIRUS 2 BY RT PCR (HOSPITAL ORDER, PERFORMED IN ~~LOC~~ HOSPITAL LAB): SARS Coronavirus 2: NEGATIVE

## 2019-10-23 MED ORDER — ENOXAPARIN SODIUM 40 MG/0.4ML ~~LOC~~ SOLN
40.0000 mg | SUBCUTANEOUS | Status: DC
  Administered 2019-10-23: 40 mg via SUBCUTANEOUS
  Filled 2019-10-23: qty 0.4

## 2019-10-23 MED ORDER — BISACODYL 10 MG RE SUPP: 10.0000 mg | Freq: Every day | RECTAL | Status: DC | PRN

## 2019-10-23 MED ORDER — ONDANSETRON HCL 4 MG/2ML IJ SOLN: 4.0000 mg | Freq: Four times a day (QID) | INTRAMUSCULAR | Status: DC | PRN

## 2019-10-23 MED ORDER — OXYCODONE HCL 5 MG PO TABS: 5.0000 mg | ORAL_TABLET | ORAL | Status: DC | PRN

## 2019-10-23 MED ORDER — CIPROFLOXACIN IN D5W 400 MG/200ML IV SOLN
400.0000 mg | Freq: Once | INTRAVENOUS | Status: AC
  Administered 2019-10-23: 400 mg via INTRAVENOUS
  Filled 2019-10-23: qty 200

## 2019-10-23 MED ORDER — METRONIDAZOLE IN NACL 5-0.79 MG/ML-% IV SOLN
500.0000 mg | Freq: Once | INTRAVENOUS | Status: AC
  Administered 2019-10-23: 500 mg via INTRAVENOUS
  Filled 2019-10-23: qty 100

## 2019-10-23 MED ORDER — SODIUM CHLORIDE 0.9 % IV BOLUS
500.0000 mL | Freq: Once | INTRAVENOUS | Status: AC
  Administered 2019-10-23: 500 mL via INTRAVENOUS

## 2019-10-23 MED ORDER — IOHEXOL 300 MG/ML  SOLN
80.0000 mL | Freq: Once | INTRAMUSCULAR | Status: AC | PRN
  Administered 2019-10-23: 80 mL via INTRAVENOUS

## 2019-10-23 MED ORDER — ACETAMINOPHEN 325 MG PO TABS: 650.0000 mg | ORAL_TABLET | Freq: Four times a day (QID) | ORAL | Status: DC | PRN

## 2019-10-23 MED ORDER — SENNOSIDES-DOCUSATE SODIUM 8.6-50 MG PO TABS
1.0000 | ORAL_TABLET | Freq: Two times a day (BID) | ORAL | Status: DC
  Administered 2019-10-23 – 2019-10-25 (×4): 1 via ORAL
  Filled 2019-10-23 (×4): qty 1

## 2019-10-23 MED ORDER — MORPHINE SULFATE (PF) 2 MG/ML IV SOLN: 2.0000 mg | INTRAVENOUS | Status: DC | PRN

## 2019-10-23 MED ORDER — SODIUM CHLORIDE 0.9 % IV BOLUS
500.0000 mL | Freq: Once | INTRAVENOUS | Status: AC
Start: 2019-10-23 — End: 2019-10-23
  Administered 2019-10-23: 500 mL via INTRAVENOUS

## 2019-10-23 MED ORDER — ONDANSETRON HCL 4 MG PO TABS: 4.0000 mg | ORAL_TABLET | Freq: Four times a day (QID) | ORAL | Status: DC | PRN

## 2019-10-23 MED ORDER — SODIUM CHLORIDE 0.9 % IV SOLN: INTRAVENOUS | Status: DC

## 2019-10-23 MED ORDER — ACETAMINOPHEN 650 MG RE SUPP: 650.0000 mg | Freq: Four times a day (QID) | RECTAL | Status: DC | PRN

## 2019-10-23 MED ORDER — HYDROCORTISONE (PERIANAL) 2.5 % EX CREA
TOPICAL_CREAM | Freq: Two times a day (BID) | CUTANEOUS | Status: DC
  Administered 2019-10-24: 1 via RECTAL
  Filled 2019-10-23 (×3): qty 28.35

## 2019-10-23 MED ORDER — POLYETHYLENE GLYCOL 3350 17 G PO PACK
17.0000 g | PACK | Freq: Every day | ORAL | Status: DC
  Administered 2019-10-23 – 2019-10-25 (×3): 17 g via ORAL
  Filled 2019-10-23 (×3): qty 1

## 2019-10-23 NOTE — ED Notes (Signed)
Gave CHEM -8 to MD and RN.

## 2019-10-23 NOTE — ED Provider Notes (Signed)
[facility] DEPT Provider Note   CSN: [id number] Arrival date & time: 10/23/19  1049     History Chief Complaint  Patient presents with  . Abdominal Pain    [name] is a 85 y.o. female.  HPI 85 year old female with a history of diverticulitis, GERD, hypertension presents to the ER w/ 2 days of abdominal pain. History provided by the daughter as the patient has a history of dementia, level 5 caveat. Daughter states she has been complaining of abdominal pain for 2 days, has not had a bowel movement in about 4 days. Has a history of constipation but not on laxatives. Daughter states she has not been eating or drinking very well. Denies any nausea of vomiting. No cough, shortness of breath, chest pain. Has a history of an abdominal hysterectomy. Denies any fevers or chills. No prior history of cancer.     Past Medical History:  Diagnosis Date  . Diverticulitis   . GERD (gastroesophageal reflux disease)   . Hypertension     Patient Active Problem List   Diagnosis Date Noted  . 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 ABDOMINAL HYSTERECTOMY    . ABDOMINAL SURGERY       OB History   No obstetric history on file.     Family History  Problem Relation Age of Onset  . Heart failure Father     Social History   Tobacco Use  . Smoking status: Never Smoker  . Smokeless tobacco: Never Used  Substance Use Topics  . Alcohol use: No  . Drug use: No    Home Medications Prior to Admission medications   Medication Sig Start Date End Date Taking? Authorizing Provider  acetaminophen (TYLENOL) 325 MG tablet Take 650 mg by mouth every 6 (six) hours as needed for moderate pain or headache.    [provider]  iron polysaccharides (NIFEREX) 150 MG capsule Take 150 mg by mouth daily.    [provider]  ondansetron (ZOFRAN ODT) 4 MG disintegrating tablet Take 1  tablet (4 mg total) by mouth every 8 (eight) hours as needed for nausea or vomiting. 03/07/15   Little, Wenda Overland, MD  pravastatin (PRAVACHOL) 40 MG tablet Take 40 mg by mouth at bedtime.    [provider]  traMADol (ULTRAM) 50 MG tablet Take 1 tablet (50 mg total) by mouth every 6 (six) hours as needed. 02/11/17   Virgel Manifold, MD    Allergies    Penicillins  Review of Systems   Review of Systems  Constitutional: Positive for appetite change. Negative for chills and fever.  HENT: Negative for ear pain.   Eyes: Negative for pain and visual disturbance.  Respiratory: Negative for cough and shortness of breath.   Cardiovascular: Negative for chest pain and palpitations.  Gastrointestinal: Positive for abdominal pain and constipation. Negative for vomiting.  Genitourinary: Negative for dysuria and hematuria.  Musculoskeletal: Negative for arthralgias and back pain.  Skin: Negative for color change and rash.  Neurological: Negative for seizures and syncope.  All other systems reviewed and are negative.   Physical Exam Updated Vital Signs BP (!) 120/58 (BP Location: Left Arm)   Pulse 77   Temp 97.7 F (36.5 C) (Axillary)   Resp 15   SpO2 98%   Physical Exam Vitals and nursing note reviewed.  Constitutional:      General: She is not in acute distress.  Appearance: She is well-developed. She is not ill-appearing, toxic-appearing or diaphoretic.  HENT:     Head: Normocephalic and atraumatic.  Eyes:     Conjunctiva/sclera: Conjunctivae normal.  Cardiovascular:     Rate and Rhythm: Normal rate and regular rhythm.     Heart sounds: Normal heart sounds. No murmur heard.   Pulmonary:     Effort: Pulmonary effort is normal. No respiratory distress.     Breath sounds: Normal breath sounds.  Abdominal:     General: Abdomen is flat. Bowel sounds are decreased.     Palpations: Abdomen is soft.     Tenderness: There is generalized abdominal tenderness. There is  guarding. Negative signs include Murphy's sign and McBurney's sign.  Musculoskeletal:     Cervical back: Neck supple.  Skin:    General: Skin is warm and dry.  Neurological:     Mental Status: She is alert.     ED Results / Procedures / Treatments   Labs (all labs ordered are listed, but only abnormal results are displayed) Labs Reviewed  CBC WITH DIFFERENTIAL/PLATELET - Abnormal; Notable for the following components:      Result Value   Hemoglobin 11.7 (*)    HCT 35.4 (*)    All other components within normal limits  COMPREHENSIVE METABOLIC PANEL - Abnormal; Notable for the following components:   Glucose, Bld 100 (*)    Creatinine, Ser 1.12 (*)    Calcium 12.3 (*)    Albumin 3.3 (*)    GFR calc non Af Amer 45 (*)    GFR calc Af Amer 53 (*)    All other components within normal limits  URINALYSIS, ROUTINE W REFLEX MICROSCOPIC - Abnormal; Notable for the following components:   APPearance TURBID (*)    Leukocytes,Ua LARGE (*)    WBC, UA >50 (*)    Bacteria, UA RARE (*)    All other components within normal limits  I-STAT CHEM 8, ED - Abnormal; Notable for the following components:   Creatinine, Ser 1.20 (*)    Calcium, Ion 1.56 (*)    TCO2 33 (*)    Hemoglobin 11.9 (*)    HCT 35.0 (*)    All other components within normal limits  SARS CORONAVIRUS 2 BY RT PCR (HOSPITAL ORDER, [facility])  URINE CULTURE    EKG EKG Interpretation  Date/Time:  Sunday October 23 2019 13:02:17 EDT Ventricular Rate:  73 PR Interval:    QRS Duration: 74 QT Interval:  396 QTC Calculation: 437 R Axis:   37 Text Interpretation: Sinus rhythm Artifact Otherwise within normal limits Confirmed by Carmin Muskrat [phone]) on 10/23/2019 1:59:40 PM   Radiology CT ABDOMEN PELVIS W CONTRAST  Result Date: 10/23/2019 CLINICAL DATA:  Abdominal pain, did not have a bowel movement for 4 days then had 1 today, history of dementia, hypertension, GERD, diverticulitis,  hysterectomy EXAM: CT ABDOMEN AND PELVIS WITH CONTRAST TECHNIQUE: Multidetector CT imaging of the abdomen and pelvis was performed using the standard protocol following bolus administration of intravenous contrast. Sagittal and coronal MPR images reconstructed from axial data set. CONTRAST:  12mL OMNIPAQUE IOHEXOL 300 MG/ML SOLN IV. No oral contrast. COMPARISON:  02/11/2017 FINDINGS: Lower chest: Small bibasilar pleural effusions and mild dependent atelectasis. Minimal pericardial fluid. Hepatobiliary: Gallbladder and liver normal appearance Pancreas: Atrophic without mass Spleen: Normal appearance Adrenals/Urinary Tract: Small calcifications in the adrenal glands bilaterally which may reflect prior infection or hemorrhage. BILATERAL renal cysts larger on RIGHT measuring 3.9  x 3.4 cm. No hydronephrosis. Soft tissue mass arising from the inferior pole of the RIGHT kidney 2.9 x 2.2 x 3.0 cm. No ureteral calcification or dilatation. Two small calcifications are seen at the LEFT lateral bladder, either mural calcifications within the wall or tiny adjacent bladder calculi. Mild thickening of bladder wall. Stomach/Bowel: Prominent stool in rectum with slightly prominent rectal wall, could represent stercoral colitis. Appendix not visualized. Colonic diverticulosis without evidence of diverticulitis. Several small bowel loops in the LEFT upper quadrant/mid abdomen are involved by a partially necrotic soft tissue mass which is macrolobulated and overall measures 9.2 x 9.1 x 6.7 cm. No evidence of bowel obstruction. Remaining bowel loops unremarkable. Vascular/Lymphatic: Atherosclerotic calcifications aorta, iliac arteries, coronary arteries. Aorta normal caliber. Vascular structures patent. Abnormal enlarged lymph nodes seen gastric hepatic ligament 10 mm image 62, LEFT para-aortic 20 mm image 98, LEFT iliac bifurcation 21 mm image 168. Reproductive: Uterus surgically absent. Ovaries not definitely visualized. Other: Small  amount of free pelvic fluid as well as free fluid perihepatic and perisplenic. Multiple soft tissue masses are seen within the abdomen consistent with carcinomatosis, largest of these LEFT upper quadrant superior and anterior to the dominant mass 2.9 x 2.8 x 3.0 cm, mid abdomen 3.4 x 2.3 x 3.6 cm, LEFT gutter 1.9 x 1.8 x 1.8 cm, and several small nodules in the pelvis and LEFT upper quadrant. Perineal nodule 1.8 cm. Musculoskeletal: Enhancing soft tissue mass within LEFT iliopsoas 3.0 cm consistent with metastasis. Osseous demineralization. Degenerative disc disease changes thoracolumbar spine. IMPRESSION: Large partially necrotic soft tissue mass in the LEFT upper quadrant/mid abdomen 9.2 x 9.1 x 6.7 cm, associated with multiple small bowel loops in the LEFT upper quadrant/mid abdomen, consistent with neoplasm or lymphoma. Multiple additional soft tissue masses are seen within the abdomen and pelvis as well as a small amount of ascites and scattered adenopathy consistent with metastatic disease and carcinomatosis. RIGHT renal mass 2.9 cm greatest size, question primary renal cell carcinoma versus metastasis. Small bibasilar pleural effusions and mild dependent atelectasis. Prominent stool in rectum with slightly prominent rectal wall, could represent stercoral colitis. Aortic Atherosclerosis (ICD10-I70.0). Electronically Signed   By: Lavonia Dana M.D.   On: 10/23/2019 17:12    Procedures Procedures (including critical care time)  Medications Ordered in ED Medications  sodium chloride 0.9 % bolus 500 mL (has no administration in time range)  ciprofloxacin (CIPRO) IVPB 400 mg (400 mg Intravenous New Bag/Given 10/23/19 1752)  metroNIDAZOLE (FLAGYL) IVPB 500 mg (has no administration in time range)  sodium chloride 0.9 % bolus 500 mL (500 mLs Intravenous New Bag/Given 10/23/19 1457)  iohexol (OMNIPAQUE) 300 MG/ML solution 80 mL (80 mLs Intravenous Contrast Given 10/23/19 1632)    ED Course  I have reviewed  the triage vital signs and the nursing notes.  Pertinent labs & imaging results that were available during my care of the patient were reviewed by me and considered in my medical decision making (see chart for details).    MDM Rules/Calculators/A&P                         85 year old female with constipation, abdominal pain x2 days On presentation, she is alert, however is not answering questions I cannot tell me a lot about what brought her into the ER.  History is provided largely by the daughter.  Vitals overall reassuring, she is afebrile, no evidence of sepsis.  Physical exam with generalized abdominal tenderness.  Lab  work most notable with hypercalcemia of 12.3, creatinine of 1.12 and a GFR 45.  Normal BUN.  Given hypercalcemia, i-STAT Chem-8 showed a iodine ionized calcium of 1.56.  No significant EKG changes seen.  CBC without leukocytosis, slightly decreased hemoglobin of 11.7.  UA with large leukocytes, more than 50 WBCs and rare bacteria, could be suggestive of a UTI.  Urine culture pending.  Negative Covid.  CT of the abdomen with large partially necrotic soft tissue mass in the left upper quadrant with associated multiple small bowel loops in the left upper quadrant consistent with a neoplasm or lymphoma.  She also has multiple soft tissue masses within the abdomen and pelvis, along with a right renal mass as well.  She received 1 L of IV fluids, started on IV Cipro and Flagyl as the patient is allergic to penicillins.  Consulted hospitalist Dr. Posey Pronto who will admit the patient for further oncology work-up and monitoring.  Patient's daughter was updated on the CT scan findings.  Remains hemodynamically stable here in the ED at this time  Patient was seen and evaluated by Dr. Vanita Panda who is agreeable to the above plan and disposition   Final Clinical Impression(s) / ED Diagnoses Final diagnoses:  Hypercalcemia  Urinary tract infection without hematuria, site unspecified  Abdominal mass,  left upper quadrant    Rx / DC Orders ED Discharge Orders    None       Lyndel Safe 10/23/19 1812    Carmin Muskrat, MD 10/26/19 2258

## 2019-10-23 NOTE — H&P (Signed)
[facility] History and Physical   Patient: [name] WCH:[id number]   PCP: Waylan Rocher, MD DOB: 09-24-1934   DOA: 10/23/2019   DOS: 10/23/2019   DOS: the patient was seen and examined on 10/23/2019  Patient coming from: The patient is coming from Home  Chief Complaint: Constipation  HPI: [name] is a 85 y.o. female with Past medical history of GERD, HTN, small bowel obstruction likely volvulus SP expiratory laparotomy. Patient presented with complaints of constipation.  Reported history only for 2 days with Patient is unable to provide complete history due to dementia. The daughter reports that the patient actually has been suffering from constipation for a while. [place] and had enema due to severe constipation. She was told that she was having dehydration back then. 2 weeks ago patient had another enema. Patient started having poor p.o. intake lately. Has lost 15 lbs. in 2 weeks time Per daughter. No vomiting were reported. No other acute complaints reported by patient. Patient denied any complaints of nausea, abdominal pain, chest pain, fever, chills.  No diarrhea reported.  ED Course: Presents with constipation.  Due to severe abdominal pain underwent CT abdomen pelvis which showed a large soft tissue mass on the left upper quadrant area along with multiple lesions concerning for carcinomatosis and metastasis as well as possible renal mass.  Patient also had calcium levels of 12.2. Patient was referred for admission.   Review of Systems: as mentioned in the history of present illness.  All other systems reviewed and are negative.  Past Medical History:  Diagnosis Date  . Diverticulitis   . GERD (gastroesophageal reflux disease)   . Hypertension    Past Surgical History:  Procedure Laterality Date  . ABDOMINAL HYSTERECTOMY    . ABDOMINAL SURGERY     Social History:  reports that she has never  smoked. She has never used smokeless tobacco. She reports that she does not drink alcohol and does not use drugs.  Allergies  Allergen Reactions  . Penicillins Hives and Rash    Has patient had a PCN reaction causing immediate rash, facial/tongue/throat swelling, SOB or lightheadedness with hypotension: Yes Has patient had a PCN reaction causing severe rash involving mucus membranes or skin necrosis: Yes Has patient had a PCN reaction that required hospitalization Yes Has patient had a PCN reaction occurring within the last 10 years: No If all of the above answers are "NO", then may proceed with Cephalosporin use.    Family history reviewed and not pertinent Family History  Problem Relation Age of Onset  . Heart failure Father      Prior to Admission medications   Medication Sig Start Date End Date Taking? Authorizing Provider  acetaminophen (TYLENOL) 325 MG tablet Take 650 mg by mouth every 6 (six) hours as needed for moderate pain or headache.    [provider]  iron polysaccharides (NIFEREX) 150 MG capsule Take 150 mg by mouth daily.    [provider]  ondansetron (ZOFRAN ODT) 4 MG disintegrating tablet Take 1 tablet (4 mg total) by mouth every 8 (eight) hours as needed for nausea or vomiting. 03/07/15   Little, Wenda Overland, MD  pravastatin (PRAVACHOL) 40 MG tablet Take 40 mg by mouth at bedtime.    [provider]  traMADol (ULTRAM) 50 MG tablet Take 1 tablet (50 mg total) by mouth every 6 (six) hours as needed. 02/11/17   Kohut,  Annie Main, MD    Physical Exam: Vitals:   10/23/19 1606 10/23/19 1809 10/23/19 1918 10/23/19 1930  BP: 118/64 (!) 120/58  115/60  Pulse: 70 77    Resp: 19 15  20   Temp:   99.1 F (37.3 C)   TempSrc:   Oral   SpO2: 98% 98%      General: alert and oriented to person. Appear in mild distress, affect flat in affect Eyes: PERRL, Conjunctiva normal ENT: Oral Mucosa Clear, moist  Neck: no JVD, no Abnormal Mass Or  lumps Cardiovascular: S1 and S2 Present, no Murmur, peripheral pulses symmetrical Respiratory: good respiratory effort, Bilateral Air entry equal and Decreased, no signs of accessory muscle use, Clear to Auscultation, no Crackles, no wheezes Abdomen: Bowel Sound present, Soft and mild  diffuse tenderness, no hernia Skin: no rashes  Extremities: no Pedal edema, no calf tenderness Neurologic: without any new focal findings Gait not checked due to patient safety concerns  Data Reviewed: I have personally reviewed and interpreted labs, imaging as discussed below.  CBC: Recent Labs  Lab 10/23/19 1212 10/23/19 1434  WBC 7.0  --   NEUTROABS 5.0  --   HGB 11.7* 11.9*  HCT 35.4* 35.0*  MCV 91.0  --   PLT 397  --    Basic Metabolic Panel: Recent Labs  Lab 10/23/19 1212 10/23/19 1434  NA 145 143  K 3.9 3.7  CL 101 102  CO2 32  --   GLUCOSE 100* 92  BUN 22 23  CREATININE 1.12* 1.20*  CALCIUM 12.3*  --    GFR: CrCl cannot be calculated (Unknown ideal weight.). Liver Function Tests: Recent Labs  Lab 10/23/19 1212  AST 32  ALT 8  ALKPHOS 40  BILITOT 0.7  PROT 6.8  ALBUMIN 3.3*   No results for input(s): LIPASE, AMYLASE in the last 168 hours. No results for input(s): AMMONIA in the last 168 hours. Coagulation Profile: No results for input(s): INR, PROTIME in the last 168 hours. Cardiac Enzymes: No results for input(s): CKTOTAL, CKMB, CKMBINDEX, TROPONINI in the last 168 hours. BNP (last 3 results) No results for input(s): PROBNP in the last 8760 hours. HbA1C: No results for input(s): HGBA1C in the last 72 hours. CBG: No results for input(s): GLUCAP in the last 168 hours. Lipid Profile: No results for input(s): CHOL, HDL, LDLCALC, TRIG, CHOLHDL, LDLDIRECT in the last 72 hours. Thyroid Function Tests: No results for input(s): TSH, T4TOTAL, FREET4, T3FREE, THYROIDAB in the last 72 hours. Anemia Panel: No results for input(s): VITAMINB12, FOLATE, FERRITIN, TIBC, IRON,  RETICCTPCT in the last 72 hours. Urine analysis:    Component Value Date/Time   COLORURINE YELLOW 10/23/2019 1212   APPEARANCEUR TURBID (A) 10/23/2019 1212   LABSPEC 1.013 10/23/2019 1212   PHURINE 6.0 10/23/2019 1212   GLUCOSEU NEGATIVE 10/23/2019 1212   HGBUR NEGATIVE 10/23/2019 1212   BILIRUBINUR NEGATIVE 10/23/2019 1212   KETONESUR NEGATIVE 10/23/2019 1212   PROTEINUR NEGATIVE 10/23/2019 1212   UROBILINOGEN 1.0 05/04/2010 2224   NITRITE NEGATIVE 10/23/2019 1212   LEUKOCYTESUR LARGE (A) 10/23/2019 1212    Radiological Exams on Admission: CT ABDOMEN PELVIS W CONTRAST  Result Date: 10/23/2019 CLINICAL DATA:  Abdominal pain, did not have a bowel movement for 4 days then had 1 today, history of dementia, hypertension, GERD, diverticulitis, hysterectomy EXAM: CT ABDOMEN AND PELVIS WITH CONTRAST TECHNIQUE: Multidetector CT imaging of the abdomen and pelvis was performed using the standard protocol following bolus administration of intravenous contrast. Sagittal and coronal MPR  images reconstructed from axial data set. CONTRAST:  52mL OMNIPAQUE IOHEXOL 300 MG/ML SOLN IV. No oral contrast. COMPARISON:  02/11/2017 FINDINGS: Lower chest: Small bibasilar pleural effusions and mild dependent atelectasis. Minimal pericardial fluid. Hepatobiliary: Gallbladder and liver normal appearance Pancreas: Atrophic without mass Spleen: Normal appearance Adrenals/Urinary Tract: Small calcifications in the adrenal glands bilaterally which may reflect prior infection or hemorrhage. BILATERAL renal cysts larger on RIGHT measuring 3.9 x 3.4 cm. No hydronephrosis. Soft tissue mass arising from the inferior pole of the RIGHT kidney 2.9 x 2.2 x 3.0 cm. No ureteral calcification or dilatation. Two small calcifications are seen at the LEFT lateral bladder, either mural calcifications within the wall or tiny adjacent bladder calculi. Mild thickening of bladder wall. Stomach/Bowel: Prominent stool in rectum with slightly  prominent rectal wall, could represent stercoral colitis. Appendix not visualized. Colonic diverticulosis without evidence of diverticulitis. Several small bowel loops in the LEFT upper quadrant/mid abdomen are involved by a partially necrotic soft tissue mass which is macrolobulated and overall measures 9.2 x 9.1 x 6.7 cm. No evidence of bowel obstruction. Remaining bowel loops unremarkable. Vascular/Lymphatic: Atherosclerotic calcifications aorta, iliac arteries, coronary arteries. Aorta normal caliber. Vascular structures patent. Abnormal enlarged lymph nodes seen gastric hepatic ligament 10 mm image 62, LEFT para-aortic 20 mm image 98, LEFT iliac bifurcation 21 mm image 168. Reproductive: Uterus surgically absent. Ovaries not definitely visualized. Other: Small amount of free pelvic fluid as well as free fluid perihepatic and perisplenic. Multiple soft tissue masses are seen within the abdomen consistent with carcinomatosis, largest of these LEFT upper quadrant superior and anterior to the dominant mass 2.9 x 2.8 x 3.0 cm, mid abdomen 3.4 x 2.3 x 3.6 cm, LEFT gutter 1.9 x 1.8 x 1.8 cm, and several small nodules in the pelvis and LEFT upper quadrant. Perineal nodule 1.8 cm. Musculoskeletal: Enhancing soft tissue mass within LEFT iliopsoas 3.0 cm consistent with metastasis. Osseous demineralization. Degenerative disc disease changes thoracolumbar spine. IMPRESSION: Large partially necrotic soft tissue mass in the LEFT upper quadrant/mid abdomen 9.2 x 9.1 x 6.7 cm, associated with multiple small bowel loops in the LEFT upper quadrant/mid abdomen, consistent with neoplasm or lymphoma. Multiple additional soft tissue masses are seen within the abdomen and pelvis as well as a small amount of ascites and scattered adenopathy consistent with metastatic disease and carcinomatosis. RIGHT renal mass 2.9 cm greatest size, question primary renal cell carcinoma versus metastasis. Small bibasilar pleural effusions and mild  dependent atelectasis. Prominent stool in rectum with slightly prominent rectal wall, could represent stercoral colitis. Aortic Atherosclerosis (ICD10-I70.0). Electronically Signed   By: Lavonia Dana M.D.   On: 10/23/2019 17:12   I reviewed all nursing notes, pharmacy notes, vitals, pertinent old records.  Assessment/Plan 1. Intra-abdominal malignant neoplasm ([place]) Presents with constellation scan shows evidence of intra-abdominal mass. Without any significant obstruction. At present this is concerning for malignancy with carcinomatosis and abdominal metastasis. Primary is unknown. CT scan 2019 did not show any malignancy. At present patient will most likely not be a surgical candidate for any intervention. Given patient's history of dementia as well as poor performance status likely the patient will also not be able candidate for chemotherapy. Anticipating poor and short term prognosis for the patient due to her comorbidities and therefore recommended to consider palliative approach with hospice. Palliative care consulted. Pain control.  2.  Constipation. Bowel regimen.   3.  Hypercalcemia of malignancy. Continue with IV fluids with insulin monitor.  4.  Goals of care conversation. Extensive  conversation with patient's daughter who was at bedside. Patient does have a MOST form in which she has answered yes to all the questions for daughter. Daughter is currently the next of the kin and patient does not appear to be having any understanding of her medical condition given her dementia. Knowing that the patient actually now has a metastatic malignancy patient will benefit from being on DNR/DNI per daughter. CODE STATUS changed. Palliative care consulted. Monitor.  5.  Stercoral colitis. Hydrocortisone suppository.  Nutrition: Cardiac diet DVT Prophylaxis: Subcutaneous Lovenox  Advance goals of care discussion: DNR   Consults: Palliative care   Family Communication: family was  present at bedside, at the time of interview.  Opportunity was given to ask question and all questions were answered satisfactorily.   Disposition:  From: Home  Author: Berle Mull, MD [facility] 10/23/2019 7:37 PM   To reach On-call, see care teams to locate the attending and reach out to them via [email or web address]. If 7PM-7AM, please contact night-coverage If you still have difficulty reaching the attending provider, please page the [facility] (Director on Call) for [facility] on amion for assistance.

## 2019-10-23 NOTE — ED Notes (Signed)
Attempted to call report to 6E; unable to take report yet, they say that they will call back after they get report

## 2019-10-23 NOTE — ED Triage Notes (Signed)
Pt complaint of abdominal pain, history of dementia. Last bm 4 days ago until now when pt got into room.

## 2019-10-24 ENCOUNTER — Encounter (HOSPITAL_COMMUNITY): Payer: Self-pay | Admitting: Internal Medicine

## 2019-10-24 ENCOUNTER — Inpatient Hospital Stay (HOSPITAL_COMMUNITY): Payer: Medicare (Managed Care)

## 2019-10-24 DIAGNOSIS — Z66 Do not resuscitate: Secondary | ICD-10-CM | POA: Diagnosis present

## 2019-10-24 DIAGNOSIS — Z515 Encounter for palliative care: Secondary | ICD-10-CM

## 2019-10-24 LAB — CBC
HCT: 32.3 % — ABNORMAL LOW (ref 36.0–46.0)
Hemoglobin: 10.5 g/dL — ABNORMAL LOW (ref 12.0–15.0)
MCH: 30.2 pg (ref 26.0–34.0)
MCHC: 32.5 g/dL (ref 30.0–36.0)
MCV: 92.8 fL (ref 80.0–100.0)
Platelets: 338 10*3/uL (ref 150–400)
RBC: 3.48 MIL/uL — ABNORMAL LOW (ref 3.87–5.11)
RDW: 13.9 % (ref 11.5–15.5)
WBC: 6.7 10*3/uL (ref 4.0–10.5)
nRBC: 0 % (ref 0.0–0.2)

## 2019-10-24 LAB — COMPREHENSIVE METABOLIC PANEL
ALT: 8 U/L (ref 0–44)
AST: 26 U/L (ref 15–41)
Albumin: 2.5 g/dL — ABNORMAL LOW (ref 3.5–5.0)
Alkaline Phosphatase: 33 U/L — ABNORMAL LOW (ref 38–126)
Anion gap: 9 (ref 5–15)
BUN: 18 mg/dL (ref 8–23)
CO2: 28 mmol/L (ref 22–32)
Calcium: 10.3 mg/dL (ref 8.9–10.3)
Chloride: 107 mmol/L (ref 98–111)
Creatinine, Ser: 0.86 mg/dL (ref 0.44–1.00)
GFR calc Af Amer: >60 mL/min (ref >60)
GFR calc non Af Amer: >60 mL/min (ref >60)
Glucose, Bld: 81 mg/dL (ref 70–99)
Potassium: 3.4 mmol/L — ABNORMAL LOW (ref 3.5–5.1)
Sodium: 144 mmol/L (ref 135–145)
Total Bilirubin: 0.7 mg/dL (ref 0.3–1.2)
Total Protein: 5.2 g/dL — ABNORMAL LOW (ref 6.5–8.1)

## 2019-10-24 LAB — URINE CULTURE: Culture: NO GROWTH

## 2019-10-24 MED ORDER — BISACODYL 10 MG RE SUPP
10.0000 mg | Freq: Once | RECTAL | Status: AC
  Administered 2019-10-24: 10 mg via RECTAL
  Filled 2019-10-24: qty 1

## 2019-10-24 NOTE — Evaluation (Signed)
Physical Therapy Evaluation Patient Details Name: [name] MRN: [id number] DOB: 14-Aug-1934 Today's Date: 10/24/2019   History of Present Illness  [name] is a 85 y.o. female with Past medical history of GERD, HTN, small bowel obstruction likely volvulus SP exploratory laparotomy.Patient presented with complaints of constipation on 10/23/19.CT abdomen pelvis which showed a large soft tissue mass on the left upper quadrant area along with multiple lesions concerning for carcinomatosis and metastasis as well as possible renal mass  Clinical Impression  The patient is tearful, does not state  The reason. Stated" My sister dropped me off". The patient did require max assist to roll , sit up and pivot to recliner. No family present. Note that Palliative Medicine C/S is noted. Pt admitted with above diagnosis.  Pt currently with functional limitations due to the deficits listed below (see PT Problem List). Pt will benefit from skilled PT to increase their independence and safety with mobility to allow discharge to the venue listed below.       Follow Up Recommendations SNF (depending on GOC) and family support.     Equipment Recommendations  None recommended by PT    Recommendations for Other Services       Precautions / Restrictions Precautions Precautions: Fall Precaution Comments: incontinence      Mobility  Bed Mobility Overal bed mobility: Needs Assistance Bed Mobility: Rolling;Supine to Sit Rolling: Max assist   Supine to sit: Max assist     General bed mobility comments: rolls to left more readily than to right, tends to resist to right.  Transfers Overall transfer level: Needs assistance   Transfers: Sit to/from Stand;Stand Pivot Transfers Sit to Stand: Max assist Stand pivot transfers: Max assist       General transfer comment: bear hug transfer as patient  with posterior  pushing. does bear small amount of weight  during transfer  Ambulation/Gait              General Gait Details: TBA  Stairs            Wheelchair Mobility    Modified Rankin (Stroke Patients Only)       Balance Overall balance assessment: Needs assistance Sitting-balance support: No upper extremity supported;Feet supported Sitting balance-Leahy Scale: Poor Sitting balance - Comments: posterior leaning , will fall backwards if not supported. Postural control: Posterior lean   Standing balance-Leahy Scale: Zero Standing balance comment: requires assistance  to stnad                             Pertinent Vitals/Pain Pain Assessment: Faces Faces Pain Scale: No hurt    Home Living Family/patient expects to be discharged to:: Private residence Living Arrangements: Children Available Help at Discharge: Family;Available PRN/intermittently             Additional Comments: unsure of Prior function. comes from home , lives with daughter    Prior Function           Comments: patient unable     Hand Dominance        Extremity/Trunk Assessment   Upper Extremity Assessment Upper Extremity Assessment: Generalized weakness    Lower Extremity Assessment Lower Extremity Assessment: Generalized weakness    Cervical / Trunk Assessment Cervical / Trunk Assessment: Normal  Communication      Cognition Arousal/Alertness: Awake/alert Behavior During Therapy: Flat affect Overall Cognitive Status: No family/caregiver present to determine baseline cognitive functioning Area of Impairment: Orientation;Attention;Memory;Following  commands                 Orientation Level: Time;Situation;Place Current Attention Level: Focused Memory: Decreased short-term memory Following Commands: Follows one step commands inconsistently       General Comments: H/O dementia      General Comments      Exercises     Assessment/Plan    PT Assessment Patient needs continued PT services  PT Problem List Decreased strength;Decreased  cognition;Decreased activity tolerance;Decreased safety awareness;Decreased balance;Decreased knowledge of precautions;Decreased mobility       PT Treatment Interventions DME instruction;Balance training;Cognitive remediation;Functional mobility training;Patient/family education;Therapeutic activities;Therapeutic exercise    PT Goals (Current goals can be found in the Care Plan section)  Acute Rehab PT Goals PT Goal Formulation: Patient unable to participate in goal setting Time For Goal Achievement: 11/07/19 Potential to Achieve Goals: Fair    Frequency Min 2X/week   Barriers to discharge        Co-evaluation               AM-PAC PT "6 Clicks" Mobility  Outcome Measure Help needed turning from your back to your side while in a flat bed without using bedrails?: A Lot Help needed moving from lying on your back to sitting on the side of a flat bed without using bedrails?: A Lot Help needed moving to and from a bed to a chair (including a wheelchair)?: Total Help needed standing up from a chair using your arms (e.g., wheelchair or bedside chair)?: Total Help needed to walk in hospital room?: Total Help needed climbing 3-5 steps with a railing? : Total 6 Click Score: 8    End of Session Equipment Utilized During Treatment: Gait belt Activity Tolerance: Patient tolerated treatment well Patient left: in chair;with call bell/phone within reach;with chair alarm set;with family/visitor present Nurse Communication: Mobility status PT Visit Diagnosis: Unsteadiness on feet (R26.81);Difficulty in walking, not elsewhere classified (R26.2);Muscle weakness (generalized) (M62.81)    Time: 1050-1108 PT Time Calculation (min) (ACUTE ONLY): 18 min   Charges:   PT Evaluation $PT Eval Low Complexity: [facility] Acute Rehabilitation Services Pager [phone] Office [phone]   Claretha Cooper 10/24/2019, 11:54 AM

## 2019-10-24 NOTE — Progress Notes (Signed)
[facility] Progress Note  Patient: [name]    ZOX:[id number]  DOA: 10/23/2019     Date of Service: the patient was seen and examined on 10/24/2019  Brief hospital course: Hospital history of hypertension, GERD, SBO likely will assess for exploratory laparotomy..  Presents with complaints of constipation.  Found to have constipation as well as dehydration and hypercalcemia along with large bowel mass. Currently plan is comfort care and arrange for home hospice.  Assessment and Plan: 1. Intra-abdominal malignant neoplasm ([place]) Presents with constipation. CT scan shows evidence of large intra-abdominal mass with carcinomatosis and metastasis. Also renal mass Without any significant obstruction. Primary is unknown. CT scan 2019 did not show any malignancy. At present patient will most likely not be a surgical candidate for any intervention. Given patient's history of dementia as well as poor performance status likely the patient will also not be able candidate for chemotherapy. Anticipating poor and short prognosis for the patient due to her comorbidities and therefore recommended to consider palliative approach with hospice. Palliative care was consulted.  Currently hospice consulted. Continue pain control.  2.  Constipation. Continue bowel regimen.  3.  Hypercalcemia of malignancy. Resolved. Continue with IV fluids. Will likely recur  4.  Goals of care conversation. Extensive conversation with patient's daughter who was at bedside. Patient does have a MOST form in which she has answered yes to all the questions for daughter. Daughter is currently the next of the kin and patient does not appear to be having any understanding of her medical condition given her dementia. Knowing that the patient actually now has a metastatic malignancy patient will benefit from being on DNR/DNI per daughter. CODE STATUS changed. Currently based on my conversation with the daughter on  10/24/2019,  She would like transition care to hospice on discharge. TOC consulted.  5.  Stercoral colitis. Hydrocortisone suppository.  Diet: Soft diet.  Advance goals of care discussion: DNR  Family Communication: family was present at bedside, at the time of interview.  The pt provided permission to discuss medical plan with the family. Opportunity was given to ask question and all questions were answered satisfactorily.   Disposition:  Status is: Inpatient  Remains inpatient appropriate because:Unsafe d/c plan will need to arrange home hospice at home prior to discharge.  Dispo: The patient is from: Home              Anticipated d/c is to: Home              Anticipated d/c date is: 1 day              Patient currently is medically stable to d/c.  Subjective: Patient does not have any abdominal pain.  No nausea no vomiting her daughter reported 1 choking episode when trying to eat.  No fever no chills.  Had some bowel movement.  Physical Exam:  General: Appear in mild distress, no Rash; Oral Mucosa Clear, moist. no Abnormal Neck Mass Or lumps, Conjunctiva normal  Cardiovascular: S1 and S2 Present, no Murmur, Respiratory: good respiratory effort, Bilateral Air entry present and CTA, no Crackles, no wheezes Abdomen: Bowel Sound present, Soft and mild tenderness Extremities: no Pedal edema Neurology: alert and oriented to time, place, and person affect appropriate. no new focal deficit Gait not checked due to patient safety concerns  Vitals:   10/23/19 1930 10/23/19 2029 10/24/19 0523 10/24/19 0843  BP: 115/60 (!) 104/55 112/65 125/70  Pulse:  64 64 70  Resp: 20  19 17 16   Temp:  98.3 F (36.8 C) 98 F (36.7 C) 98.1 F (36.7 C)  TempSrc:  Oral Oral Oral  SpO2:  96% 100% 97%    Intake/Output Summary (Last 24 hours) at 10/24/2019 1958 Last data filed at 10/24/2019 1930 Gross per 24 hour  Intake 1679.16 ml  Output --  Net 1679.16 ml   There were no vitals filed for  this visit.  Data Reviewed: I have personally reviewed and interpreted daily labs, tele strips, imagings as discussed above. I reviewed all nursing notes, pharmacy notes, vitals, pertinent old records I have discussed plan of care as described above with RN and patient/family.  CBC: Recent Labs  Lab 10/23/19 1212 10/23/19 1434 10/24/19 0534  WBC 7.0  --  6.7  NEUTROABS 5.0  --   --   HGB 11.7* 11.9* 10.5*  HCT 35.4* 35.0* 32.3*  MCV 91.0  --  92.8  PLT 397  --  836   Basic Metabolic Panel: Recent Labs  Lab 10/23/19 1212 10/23/19 1434 10/24/19 0534  NA 145 143 144  K 3.9 3.7 3.4*  CL 101 102 107  CO2 32  --  28  GLUCOSE 100* 92 81  BUN 22 23 18   CREATININE 1.12* 1.20* 0.86  CALCIUM 12.3*  --  10.3    Studies: DG Abd Portable 1V  Result Date: 10/24/2019 CLINICAL DATA:  Constipation EXAM: X-RAY ABDOMEN 1 VIEW COMPARISON:  10/23/2019 CT abdomen/pelvis FINDINGS: No dilated small bowel loops. Mild colonic and moderate rectal stool. No evidence of pneumatosis or pneumoperitoneum. No radiopaque stones overlying the kidneys. Marked thoracolumbar spondylosis. IMPRESSION: Nonobstructive bowel gas pattern. Mild colonic and moderate rectal stool. Electronically Signed   By: Ilona Sorrel M.D.   On: 10/24/2019 09:22    Scheduled Meds: . hydrocortisone   Rectal BID  . polyethylene glycol  17 g Oral Daily  . senna-docusate  1 tablet Oral BID   Continuous Infusions: . sodium chloride 50 mL/hr at 10/24/19 1930   PRN Meds: acetaminophen **OR** acetaminophen, bisacodyl, morphine injection, ondansetron **OR** ondansetron (ZOFRAN) IV, oxyCODONE  Time spent: 35 minutes  Author: Berle Mull, MD [facility] 10/24/2019 7:58 PM  To reach On-call, see care teams to locate the attending and reach out via [email or web address]. Between 7PM-7AM, please contact night-coverage If you still have difficulty reaching the attending provider, please page the [facility] (Director on Call) for [facility] on amion for assistance.

## 2019-10-25 MED ORDER — HYDROCORTISONE (PERIANAL) 2.5 % EX CREA: TOPICAL_CREAM | Freq: Two times a day (BID) | CUTANEOUS | 0 refills | Status: AC

## 2019-10-25 MED ORDER — LORAZEPAM 0.5 MG PO TABS: 0.5000 mg | ORAL_TABLET | Freq: Three times a day (TID) | ORAL | 0 refills | Status: AC | PRN

## 2019-10-25 MED ORDER — HALOPERIDOL 2 MG PO TABS: 2.0000 mg | ORAL_TABLET | Freq: Three times a day (TID) | ORAL | 0 refills | Status: AC | PRN

## 2019-10-25 MED ORDER — MORPHINE SULFATE (CONCENTRATE) 20 MG/ML PO SOLN
5.0000 mg | ORAL | 0 refills | Status: AC | PRN
Start: 2019-10-25 — End: ?

## 2019-10-25 MED ORDER — BISACODYL 10 MG RE SUPP: 10.0000 mg | Freq: Every day | RECTAL | 0 refills | Status: AC | PRN

## 2019-10-25 NOTE — Discharge Summary (Signed)
[facility] Discharge Summary   Patient: [name]:[id number]  PCP: Waylan Rocher, MD  Date of admission: 10/23/2019   Date of discharge: 10/25/2019      Discharge Diagnoses:   Principal Problem:   Intra-abdominal malignant neoplasm [facility]) Active Problems:   Hypercalcemia   AKI (acute kidney injury) ([place])   Renal mass   Lymphadenopathy   DNR (do not resuscitate)   Hospice care   Admitted From: home Disposition:  Home with hospice  Recommendations for Outpatient Follow-up:  1. PCP: establish care with hospice 2. Follow up LABS/TEST:  none   Follow-up Information    Entzminger, Mendel Corning, MD. Schedule an appointment as soon as possible for a visit in 1 week(s).   Specialty: Internal Medicine Contact information: [place] [address] [phone]              Diet recommendation: Regular diet  Activity: The patient is advised to gradually reintroduce usual activities, as tolerated  Discharge Condition: stable  Code Status: DNR   History of present illness: As per the H and P dictated on admission, "[name] is a 85 y.o. female with Past medical history of GERD, HTN, small bowel obstruction likely volvulus SP expiratory laparotomy. Patient presented with complaints of constipation.  Reported history only for 2 days with Patient is unable to provide complete history due to dementia. The daughter reports that the patient actually has been suffering from constipation for a while. [place] and had enema due to severe constipation. She was told that she was having dehydration back then. 2 weeks ago patient had another enema. Patient started having poor p.o. intake lately. Has lost 15 lbs. in 2 weeks time Per daughter. No vomiting were reported. No other acute complaints reported by patient. Patient denied any complaints of nausea, abdominal pain, chest pain, fever, chills.   No diarrhea reported."  Hospital Course:  Summary of her active problems in the hospital is as following. 1.Intra-abdominal malignant neoplasm ([place]) Presents with constipation. CT scan shows evidence of large intra-abdominal mass with carcinomatosis and metastasis. Also renal mass. Without any significant obstruction. Primary is unknown. CT scan 2019 did not show any malignancy. At present patient will most likely not be a surgical candidate for any intervention. Given patient's history of dementia as well as poor performance status likely the patient will also not be able candidate for chemotherapy. Anticipating poor and short prognosis for the patient due to her comorbidities and therefore recommended to consider palliative approach with hospice. Palliative care was consulted.  Currently hospice consulted. Continue pain control.  2.Constipation. Continue bowel regimen.  3.Hypercalcemia of malignancy. Resolved. Will likely recur  4.Goals of care conversation. Extensive conversation with patient's daughter who was at bedside. Patient does have a MOST form in which she has answered yes to all the questions for daughter. Daughter is currently the next of the kin and patient does not appear to be having any understanding of her medical condition given her dementia. Knowing that the patient actually now has a metastatic malignancy patient will benefit from being on DNR/DNI per daughter. CODE STATUS changed. Currently based on my conversation with the daughter on 10/24/2019,  She would like transition care to hospice on discharge. TOC consulted.  5.Stercoral colitis. Hydrocortisone suppository.  On the day of the discharge the patient's vitals were stable, and no other acute medical condition were reported by patient. the  patient was felt safe to be discharge at Home with hospice.  Consultants: npone Procedures: none  Discharge Exam: General: Appear in no distress,  no Rash; Oral Mucosa Clear, moist. Cardiovascular: S1 and S2 Present, no Murmur, Respiratory: normal respiratory effort, Bilateral Air entry present and no Crackles, no wheezes Abdomen: Bowel Sound present, Soft and mild tenderness, no hernia Extremities: no Pedal edema, no calf tenderness Neurology: alert and oriented to time, place, and person affect appropriate.  There were no vitals filed for this visit. Vitals:   10/24/19 2049 10/25/19 0549  BP: 119/66 119/68  Pulse: 74 75  Resp: 14 14  Temp: 98.2 F (36.8 C) 98.7 F (37.1 C)  SpO2: 100% 97%    DISCHARGE MEDICATION: Allergies as of 10/25/2019      Reactions   Penicillins Hives, Rash   Has patient had a PCN reaction causing immediate rash, facial/tongue/throat swelling, SOB or lightheadedness with hypotension: Yes Has patient had a PCN reaction causing severe rash involving mucus membranes or skin necrosis: Yes Has patient had a PCN reaction that required hospitalization Yes Has patient had a PCN reaction occurring within the last 10 years: No If all of the above answers are "NO", then may proceed with Cephalosporin use.      Medication List    STOP taking these medications   Vitamin D3 1.25 MG (50000 UT) Caps     TAKE these medications   acetaminophen 650 MG CR tablet Commonly known as: TYLENOL Take 650 mg by mouth every evening. For arthritis/chronic back pain   acetaminophen 325 MG tablet Commonly known as: TYLENOL Take 650 mg by mouth every 4 (four) hours as needed for headache (pain).   Biofreeze 4 % Gel Generic drug: Menthol (Topical Analgesic) Apply 1 application topically 4 (four) times daily as needed (joint or muscle pain).   bisacodyl 10 MG suppository Commonly known as: DULCOLAX Place 1 suppository (10 mg total) rectally daily as needed for moderate constipation.   donepezil 10 MG tablet Commonly known as: ARICEPT Take 10 mg by mouth every evening. For memory   ENSURE ENLIVE PO Take 237 mLs by  mouth 2 (two) times daily.   escitalopram 10 MG tablet Commonly known as: LEXAPRO Take 10 mg by mouth every evening. For mood   haloperidol 2 MG tablet Commonly known as: HALDOL Take 1 tablet (2 mg total) by mouth every 8 (eight) hours as needed for agitation.   hydrocortisone 2.5 % rectal cream Commonly known as: ANUSOL-HC Place rectally 2 (two) times daily.   LORazepam 0.5 MG tablet Commonly known as: Ativan Take 1 tablet (0.5 mg total) by mouth every 8 (eight) hours as needed for anxiety.   memantine 5 MG tablet Commonly known as: NAMENDA Take 5 mg by mouth every evening.   morphine 20 MG/ML concentrated solution Commonly known as: ROXANOL Take 0.25 mLs (5 mg total) by mouth every 4 (four) hours as needed for moderate pain, severe pain or shortness of breath.   nystatin cream Commonly known as: MYCOSTATIN Apply 1 application topically 3 (three) times daily. Apply to abdominal folds   polyethylene glycol 17 g packet Commonly known as: MIRALAX / GLYCOLAX Take 17 g by mouth daily as needed (constipation). Mix in 8 oz beverage of choice and drink   Senna S 8.6-50 MG tablet Generic drug: senna-docusate Take 2 tablets by mouth every evening. For constipation      Allergies  Allergen Reactions  . Penicillins Hives and Rash    Has patient  had a PCN reaction causing immediate rash, facial/tongue/throat swelling, SOB or lightheadedness with hypotension: Yes Has patient had a PCN reaction causing severe rash involving mucus membranes or skin necrosis: Yes Has patient had a PCN reaction that required hospitalization Yes Has patient had a PCN reaction occurring within the last 10 years: No If all of the above answers are "NO", then may proceed with Cephalosporin use.    Discharge Instructions    Diet - low sodium heart healthy   Complete by: As directed    Increase activity slowly   Complete by: As directed       The results of significant diagnostics from this  hospitalization (including imaging, microbiology, ancillary and laboratory) are listed below for reference.    Significant Diagnostic Studies: CT HEAD WO CONTRAST  Result Date: 10/14/2019 CLINICAL DATA:  Changes in vision. Multiple falls. Signs and symptoms involving cognition. Additional history provided by technologist: Patient reports cognition and vision changes after multiple falls, recent falls, confusion, mental status change. EXAM: CT HEAD WITHOUT CONTRAST TECHNIQUE: Contiguous axial images were obtained from the base of the skull through the vertex without intravenous contrast. COMPARISON:  Head CT 02/03/2008. FINDINGS: Brain: Moderate generalized parenchymal atrophy. Moderate/advanced ill-defined hypoattenuation within the cerebral white matter is nonspecific, but consistent with chronic small vessel ischemic disease. These findings have significantly progressed as compared to the prior head CT of 02/03/2008. There is a rounded dural-based calcification overlying the right frontal lobe measuring 4 mm which may reflect a tiny incidentally noted calcified meningioma (series 6, image 27). There is no acute intracranial hemorrhage. No demarcated cortical infarct is identified. No extra-axial fluid collection. No midline shift. Partially empty sella turcica. Vascular: No hyperdense vessel.  Atherosclerotic calcifications Skull: Normal. Negative for fracture or focal lesion. Sinuses/Orbits: Visualized orbits show no acute finding. No significant paranasal sinus disease or mastoid effusion at the imaged levels. IMPRESSION: No CT evidence of acute intracranial abnormality. Moderate generalized parenchymal atrophy with moderate/advanced cerebral white matter chronic small vessel ischemic disease, significantly progressed as compared to the prior head CT of 02/03/2008. 4 mm rounded dural-based calcification overlying the right frontal lobe, which may reflect an tiny incidental meningioma. Electronically Signed    By: Kellie Simmering DO   On: 10/14/2019 20:12   CT ABDOMEN PELVIS W CONTRAST  Result Date: 10/23/2019 CLINICAL DATA:  Abdominal pain, did not have a bowel movement for 4 days then had 1 today, history of dementia, hypertension, GERD, diverticulitis, hysterectomy EXAM: CT ABDOMEN AND PELVIS WITH CONTRAST TECHNIQUE: Multidetector CT imaging of the abdomen and pelvis was performed using the standard protocol following bolus administration of intravenous contrast. Sagittal and coronal MPR images reconstructed from axial data set. CONTRAST:  52mL OMNIPAQUE IOHEXOL 300 MG/ML SOLN IV. No oral contrast. COMPARISON:  02/11/2017 FINDINGS: Lower chest: Small bibasilar pleural effusions and mild dependent atelectasis. Minimal pericardial fluid. Hepatobiliary: Gallbladder and liver normal appearance Pancreas: Atrophic without mass Spleen: Normal appearance Adrenals/Urinary Tract: Small calcifications in the adrenal glands bilaterally which may reflect prior infection or hemorrhage. BILATERAL renal cysts larger on RIGHT measuring 3.9 x 3.4 cm. No hydronephrosis. Soft tissue mass arising from the inferior pole of the RIGHT kidney 2.9 x 2.2 x 3.0 cm. No ureteral calcification or dilatation. Two small calcifications are seen at the LEFT lateral bladder, either mural calcifications within the wall or tiny adjacent bladder calculi. Mild thickening of bladder wall. Stomach/Bowel: Prominent stool in rectum with slightly prominent rectal wall, could represent stercoral colitis. Appendix not visualized. Colonic  diverticulosis without evidence of diverticulitis. Several small bowel loops in the LEFT upper quadrant/mid abdomen are involved by a partially necrotic soft tissue mass which is macrolobulated and overall measures 9.2 x 9.1 x 6.7 cm. No evidence of bowel obstruction. Remaining bowel loops unremarkable. Vascular/Lymphatic: Atherosclerotic calcifications aorta, iliac arteries, coronary arteries. Aorta normal caliber. Vascular  structures patent. Abnormal enlarged lymph nodes seen gastric hepatic ligament 10 mm image 62, LEFT para-aortic 20 mm image 98, LEFT iliac bifurcation 21 mm image 168. Reproductive: Uterus surgically absent. Ovaries not definitely visualized. Other: Small amount of free pelvic fluid as well as free fluid perihepatic and perisplenic. Multiple soft tissue masses are seen within the abdomen consistent with carcinomatosis, largest of these LEFT upper quadrant superior and anterior to the dominant mass 2.9 x 2.8 x 3.0 cm, mid abdomen 3.4 x 2.3 x 3.6 cm, LEFT gutter 1.9 x 1.8 x 1.8 cm, and several small nodules in the pelvis and LEFT upper quadrant. Perineal nodule 1.8 cm. Musculoskeletal: Enhancing soft tissue mass within LEFT iliopsoas 3.0 cm consistent with metastasis. Osseous demineralization. Degenerative disc disease changes thoracolumbar spine. IMPRESSION: Large partially necrotic soft tissue mass in the LEFT upper quadrant/mid abdomen 9.2 x 9.1 x 6.7 cm, associated with multiple small bowel loops in the LEFT upper quadrant/mid abdomen, consistent with neoplasm or lymphoma. Multiple additional soft tissue masses are seen within the abdomen and pelvis as well as a small amount of ascites and scattered adenopathy consistent with metastatic disease and carcinomatosis. RIGHT renal mass 2.9 cm greatest size, question primary renal cell carcinoma versus metastasis. Small bibasilar pleural effusions and mild dependent atelectasis. Prominent stool in rectum with slightly prominent rectal wall, could represent stercoral colitis. Aortic Atherosclerosis (ICD10-I70.0). Electronically Signed   By: Lavonia Dana M.D.   On: 10/23/2019 17:12   DG Knee Complete 4 Views Left  Result Date: 09/29/2019 CLINICAL DATA:  Fall knee pain EXAM: LEFT KNEE - COMPLETE 4+ VIEW COMPARISON:  None. FINDINGS: No fracture or malalignment. Moderate tricompartment arthritis of the knee. Joint space calcifications. No significant knee effusion  IMPRESSION: 1. No acute osseous abnormality. 2. Tricompartment arthritis.  Chondrocalcinosis. Electronically Signed   By: Donavan Foil M.D.   On: 09/29/2019 17:06   DG Abd Portable 1V  Result Date: 10/24/2019 CLINICAL DATA:  Constipation EXAM: X-RAY ABDOMEN 1 VIEW COMPARISON:  10/23/2019 CT abdomen/pelvis FINDINGS: No dilated small bowel loops. Mild colonic and moderate rectal stool. No evidence of pneumatosis or pneumoperitoneum. No radiopaque stones overlying the kidneys. Marked thoracolumbar spondylosis. IMPRESSION: Nonobstructive bowel gas pattern. Mild colonic and moderate rectal stool. Electronically Signed   By: Ilona Sorrel M.D.   On: 10/24/2019 09:22   DG HIP UNILAT WITH PELVIS 2-3 VIEWS LEFT  Result Date: 09/29/2019 CLINICAL DATA:  Hip pain 2 weeks post fall EXAM: DG HIP (WITH OR WITHOUT PELVIS) 2-3V LEFT COMPARISON:  CT 02/11/2017, radiograph 10/27/2015 FINDINGS: No acute displaced fracture or malalignment. Pubic symphysis and left rami appear intact. Mild degenerative changes of the left hip. IMPRESSION: No acute osseous abnormality. CT or MRI follow-up may be pursued if continued clinical suspicion for hip fracture Electronically Signed   By: Donavan Foil M.D.   On: 09/29/2019 17:05    Microbiology: Recent Results (from the past 240 hour(s))  SARS Coronavirus 2 by RT PCR (hospital order, performed in [facility] hospital lab) Nasopharyngeal     Status: None   Collection Time: 10/23/19 12:22 PM   Specimen: Nasopharyngeal  Result Value Ref Range Status  SARS Coronavirus 2 NEGATIVE NEGATIVE Final    Comment: (NOTE) SARS-CoV-2 target nucleic acids are NOT DETECTED.  The SARS-CoV-2 RNA is generally detectable in upper and lower respiratory specimens during the acute phase of infection. The lowest concentration of SARS-CoV-2 viral copies this assay can detect is 250 copies / mL. A negative result does not preclude SARS-CoV-2 infection and should not be used as the sole basis for  treatment or other patient management decisions.  A negative result may occur with improper specimen collection / handling, submission of specimen other than nasopharyngeal swab, presence of viral mutation(s) within the areas targeted by this assay, and inadequate number of viral copies (<250 copies / mL). A negative result must be combined with clinical observations, patient history, and epidemiological information.  Fact Sheet for Patients:   StrictlyIdeas.no  Fact Sheet for Healthcare Providers: BankingDealers.co.za  This test is not yet approved or  cleared by the [place] FDA and has been authorized for detection and/or diagnosis of SARS-CoV-2 by FDA under an Emergency Use Authorization (EUA).  This EUA will remain in effect (meaning this test can be used) for the duration of the COVID-19 declaration under Section 564(b)(1) of the Act, 21 U.S.C. section 360bbb-3(b)(1), unless the authorization is terminated or revoked sooner.  Performed at [facility], [place] [address] [place], [place] [address]   Urine culture     Status: None   Collection Time: 10/23/19  1:54 PM   Specimen: Urine, Random  Result Value Ref Range Status   Specimen Description   Final    URINE, RANDOM Performed at [place] [address] [place], [place] [address]    Special Requests   Final    NONE Performed at [facility], [place] [address] [place], [place] [address]    Culture   Final    NO GROWTH Performed at [facility], [place] [address] [place], [place] [address]    Report Status 10/24/2019 FINAL  Final     Labs: CBC: Recent Labs  Lab 10/23/19 1212 10/23/19 1434 10/24/19 0534  WBC 7.0  --  6.7  NEUTROABS 5.0  --   --   HGB 11.7* 11.9* 10.5*  HCT 35.4* 35.0* 32.3*  MCV 91.0  --  92.8  PLT 397  --  119   Basic Metabolic Panel: Recent Labs  Lab 10/23/19 1212  10/23/19 1434 10/24/19 0534  NA 145 143 144  K 3.9 3.7 3.4*  CL 101 102 107  CO2 32  --  28  GLUCOSE 100* 92 81  BUN 22 23 18   CREATININE 1.12* 1.20* 0.86  CALCIUM 12.3*  --  10.3   Liver Function Tests: Recent Labs  Lab 10/23/19 1212 10/24/19 0534  AST 32 26  ALT 8 8  ALKPHOS 40 33*  BILITOT 0.7 0.7  PROT 6.8 5.2*  ALBUMIN 3.3* 2.5*   No results for input(s): LIPASE, AMYLASE in the last 168 hours. No results for input(s): AMMONIA in the last 168 hours. Cardiac Enzymes: No results for input(s): CKTOTAL, CKMB, CKMBINDEX, TROPONINI in the last 168 hours. BNP (last 3 results) No results for input(s): BNP in the last 8760 hours. CBG: No results for input(s): GLUCAP in the last 168 hours.  Time spent: 35 minutes  Signed:  Berle Mull  [facility] 10/25/2019 9:52 PM

## 2019-10-25 NOTE — Discharge Instructions (Signed)
Hospice Hospice is a service that is designed to provide people who are terminally ill and their families with medical, spiritual, and psychological support. Its aim is to improve your quality of life by keeping you as comfortable as possible in the final stages of life. Who will be my providers when I begin hospice care? Hospice teams often include:  A nurse.  A doctor. The hospice doctor will be available for your care, but you can include your regular doctor or nurse practitioner.  A social worker.  A counselor.  A religious leader (such as a chaplain).  A dietitian.  Therapists.  Trained volunteers who can help with care. What services does hospice provide? Hospice services can vary depending on the center or organization. Generally, they include:  Ways to keep you comfortable, such as: ? Providing care in your home or in a home-like setting. ? Working with your family and friends to help meet your needs. ? Allowing you to enjoy the support of loved ones by receiving much of your basic care from family and friends.  Pain relief and symptom management. The staff will supply all necessary medicines and equipment so that you can stay comfortable and alert enough to enjoy the company of your friends and family.  Visits or care from a nurse and doctor. This may include 24-hour on-call services.  Companionship when you are alone.  Allowing you and your family to rest. Hospice staff may do light housekeeping, prepare meals, and run errands.  Counseling. They will make sure your emotional, spiritual, and social needs are being met, as well as those needs of your family members.  Spiritual care. This will be individualized to meet your needs and your family's needs. It may involve: ? Helping you and your family understand the dying process. ? Helping you say goodbye to your family and friends. ? Performing a specific religious ceremony or ritual.  Massage.  Nutrition  therapy.  Physical and occupational therapy.  Short-term inpatient care, if something cannot be managed in the home.  Art or music therapy.  Bereavement support for grieving family members. When should hospice care begin? Most people who use hospice are believed to have less than 6 months to live.  Your family and health care providers can help you decide when hospice services should begin.  If you live longer than 6 months but your condition does not improve, your doctor may be able to approve you for continued hospice care.  If your condition improves, you may discontinue the program. What should I consider before selecting a program? Most hospice programs are run by nonprofit, independent organizations. Some are affiliated with hospitals, nursing homes, or home health care agencies. Hospice programs can take place in your home or at a hospice center, hospital, or skilled nursing facility. When choosing a hospice program, ask the following questions:  What services are available to me?  What services will be offered to my loved ones?  How involved will my loved ones be?  How involved will my health care provider be?  Who makes up the hospice care team? How are they trained or screened?  How will my pain and symptoms be managed?  If my circumstances change, can the services be provided in a different setting, such as my home or in the hospital?  Is the program reviewed and licensed by the state or certified in some other way?  What does it cost? Is it covered by insurance?  If I choose a hospice   center or nursing home, where is the hospice center located? Is it convenient for family and friends?  If I choose a hospice center or nursing home, can my family and friends visit any time?  Will you provide emotional and spiritual support?  Who can my family call with questions? Where can I learn more about hospice? You can learn about existing hospice programs in your area  from your health care providers. You can also read more about hospice online. The websites of the following organizations have helpful information:  [facility] and Palliative Care Organization [facility]): http://www.brown-buchanan.com/  National Association for [facility]): http://massey-hart.com/  Hospice Foundation of [place] ([place]): www.hospicefoundation.org  American Cancer Society (ACS): www.cancer.org  Hospice Net: www.hospicenet.org  Visiting Nurse Associations of [place] (VNAA): www.vnaa.org You may also find more information by contacting the following agencies:  A local agency on aging.  Your local [employer] chapter.  Your state's department of health or social services. Summary  Hospice is a service that is designed to provide people who are terminally ill and their families with medical, spiritual, and psychological support.  Hospice aims to improve your quality of life by keeping you as comfortable as possible in the final stages of life.  Hospice teams often include a doctor, nurse, social worker, counselor, religious leader,dietitian, therapists, and volunteers.  Hospice care generally includes medicine for symptom management, visits from doctors and nurses, physical and occupational therapy, nutrition counseling, spiritual and emotional counseling, caregiver support, and bereavement support for grieving family members.  Hospice programs can take place in your home or at a hospice center, hospital, or skilled nursing facility. This information is not intended to replace advice given to you by your health care provider. Make sure you discuss any questions you have with your health care provider. Document Revised: 10/20/2018 Document Reviewed: 02/19/2016 Elsevier Patient Education  [place].

## 2019-10-25 NOTE — TOC Transition Note (Signed)
Transition of Care [facility]) - CM/SW Discharge Note   Patient Details  Name: [name] MRN: [id number] Date of Birth: [birth year]  Transition of Care [facility] CM/SW Contact:  Lynnell Catalan, RN Phone Number: 10/25/2019, 12:35 PM   Clinical Narrative:    Pt from home with granddaughter and has services from [facility]. TOC consult was placed for home with hospice. This CM contacted pt's [facility] CSW Benita to discuss dc planning. Benita states that she has spoken with pt Granddaughter who would like to continue [place] will also provide additional support in the home. ([facility] can provide hospice like services). [facility] to transport pt home today by their transport service. RN made aware.   Final next level of care: [place]

## 2019-12-12 DEATH — deceased

## 2021-12-20 IMAGING — CR DG HIP (WITH OR WITHOUT PELVIS) 2-3V*L*
2 series · 2 of 2 positions shown · non-contrast
Comparison: CT 02/11/2017, radiograph 10/27/2015

CLINICAL DATA: Hip pain 2 weeks post fall

EXAM:
DG HIP (WITH OR WITHOUT PELVIS) 2-3V LEFT

[t hip ap left]
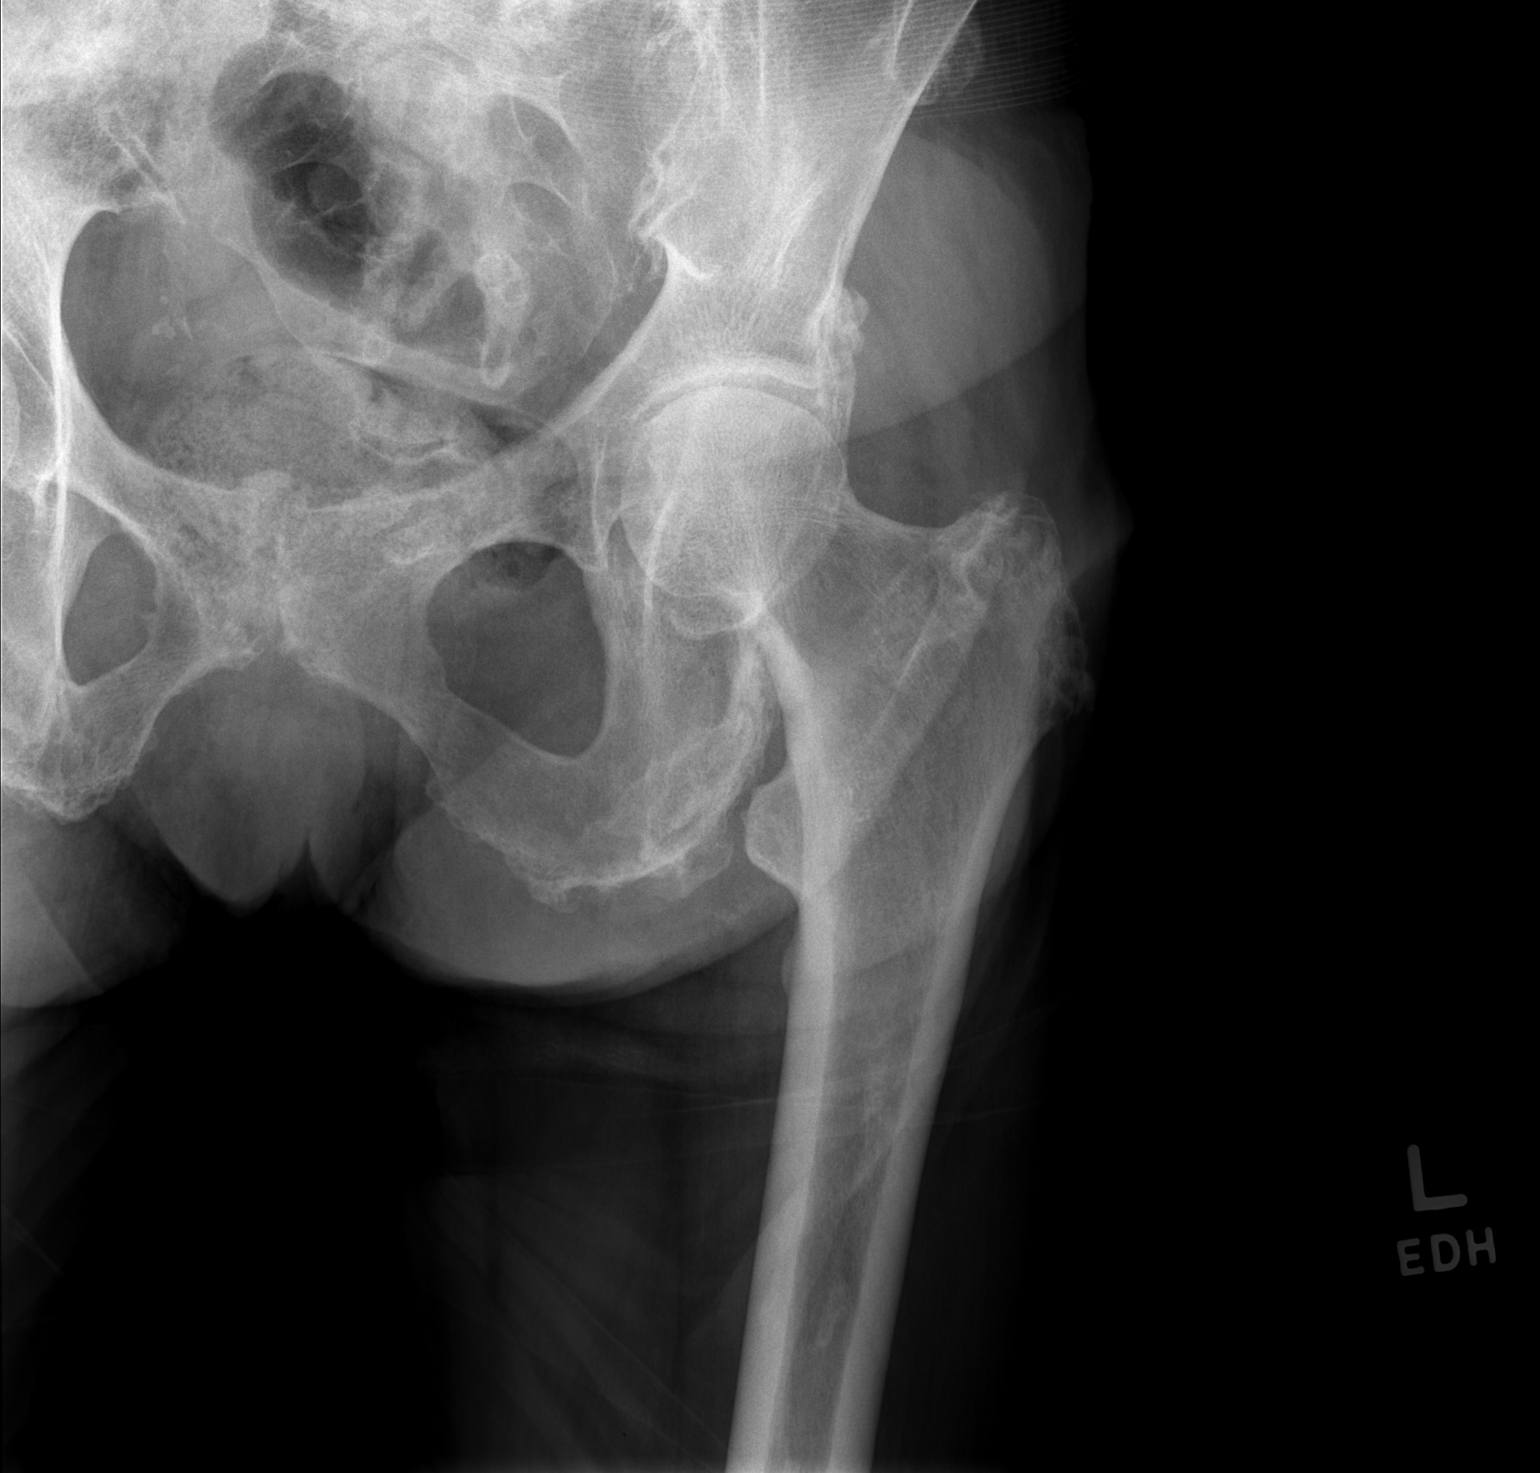

[t hip frog leg left]
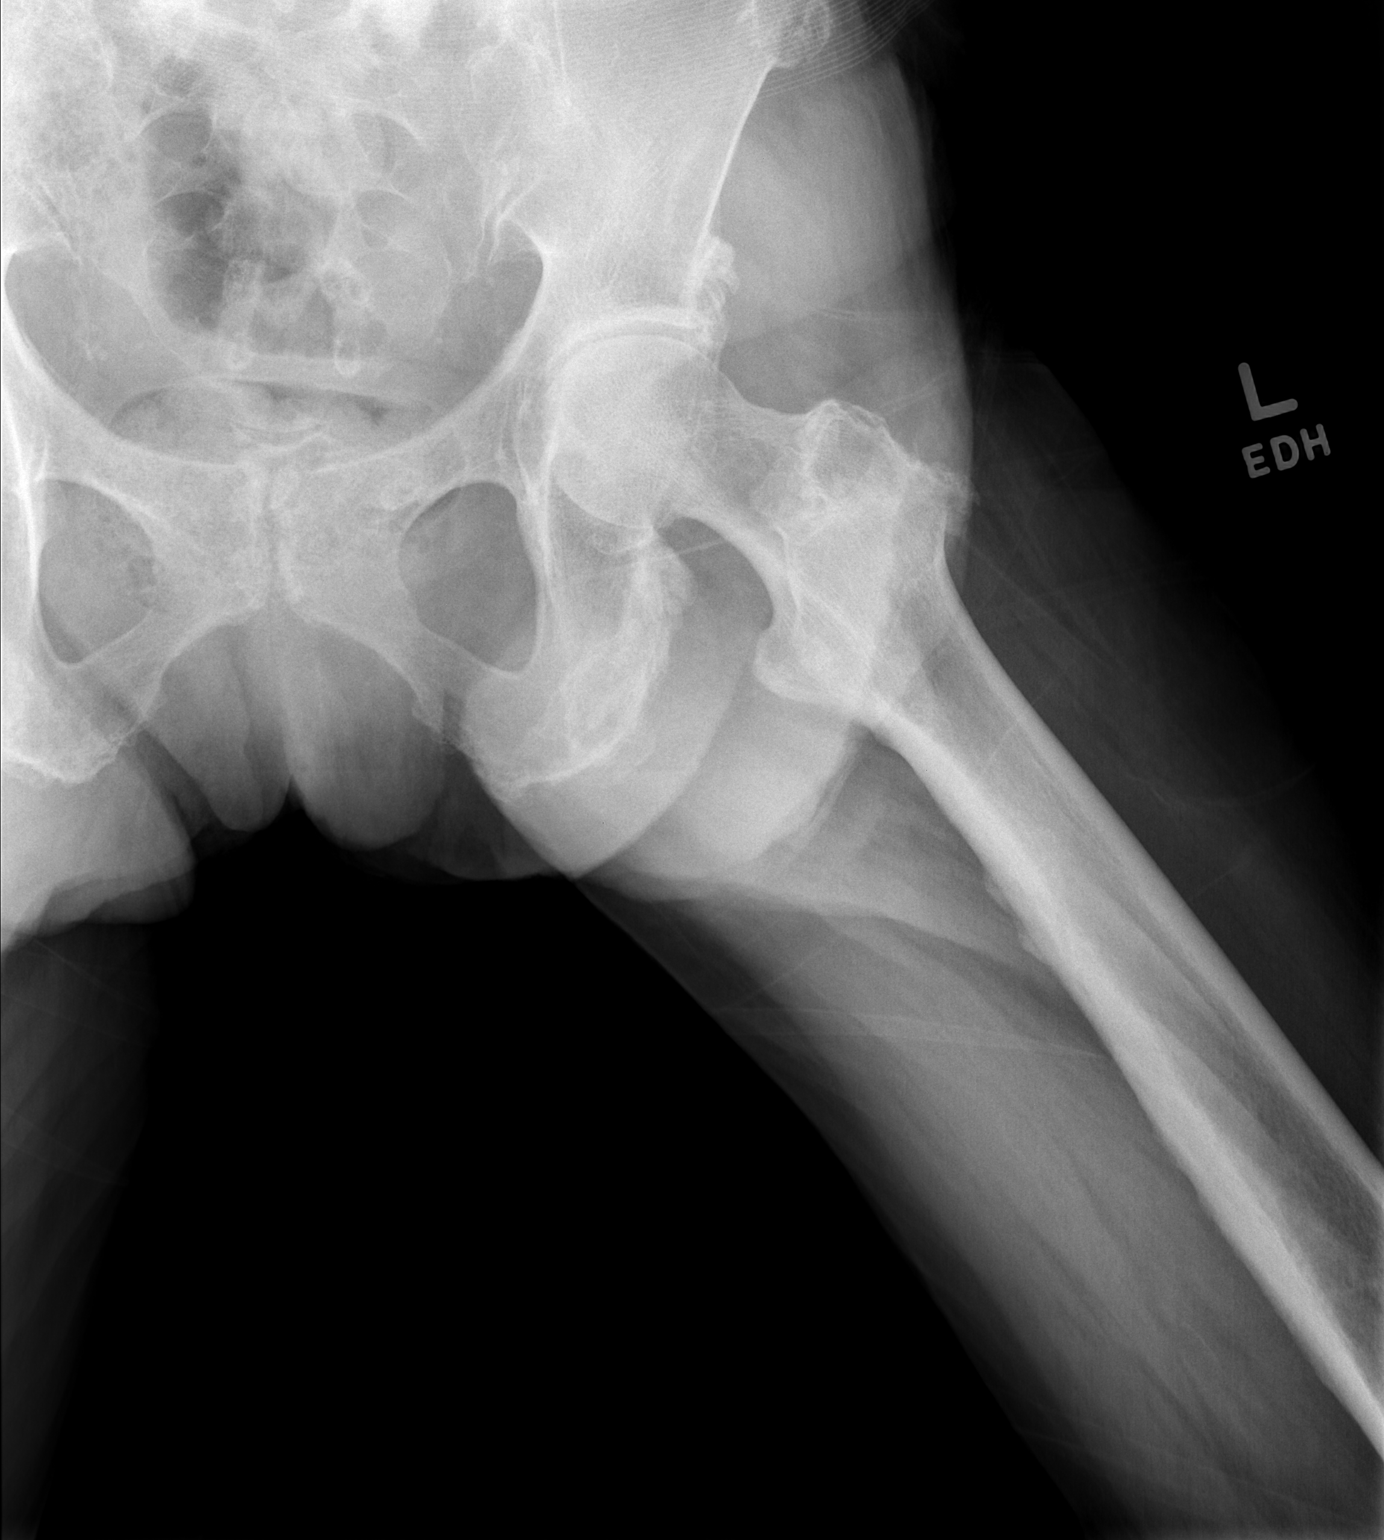

[2 of 2 positions shown; findings below may reference images not displayed]

FINDINGS: No acute displaced fracture or malalignment. Pubic symphysis and
left rami appear intact. Mild degenerative changes of the left hip.
IMPRESSION: No acute osseous abnormality. CT or MRI follow-up may be pursued if
continued clinical suspicion for hip fracture
